# Patient Record
Sex: Male | Born: 1960 | Race: Black or African American | Hispanic: No | Marital: Married | Smoking: Former smoker
Health system: Southern US, Community
[De-identification: ages and names within clinical notes are randomized; demographics above are authoritative.]

## PROBLEM LIST (undated history)

## (undated) DIAGNOSIS — F419 Anxiety disorder, unspecified: Secondary | ICD-10-CM

## (undated) DIAGNOSIS — G20 Parkinson's disease: Secondary | ICD-10-CM

## (undated) DIAGNOSIS — J45909 Unspecified asthma, uncomplicated: Secondary | ICD-10-CM

## (undated) DIAGNOSIS — I10 Essential (primary) hypertension: Secondary | ICD-10-CM

## (undated) HISTORY — PX: REPLACEMENT TOTAL KNEE: SUR1224

## (undated) HISTORY — PX: CARPAL TUNNEL RELEASE: SHX101

## (undated) HISTORY — PX: CHOLECYSTECTOMY: SHX55

---

## 2006-10-04 DIAGNOSIS — I10 Essential (primary) hypertension: Secondary | ICD-10-CM | POA: Diagnosis present

## 2019-07-20 DIAGNOSIS — E1169 Type 2 diabetes mellitus with other specified complication: Secondary | ICD-10-CM | POA: Diagnosis present

## 2020-02-17 DIAGNOSIS — G20A1 Parkinson's disease without dyskinesia, without mention of fluctuations: Secondary | ICD-10-CM | POA: Diagnosis present

## 2020-02-17 DIAGNOSIS — G20 Parkinson's disease: Secondary | ICD-10-CM | POA: Diagnosis present

## 2020-08-25 ENCOUNTER — Emergency Department: Payer: Medicare HMO

## 2020-08-25 ENCOUNTER — Other Ambulatory Visit: Payer: Self-pay

## 2020-08-25 ENCOUNTER — Emergency Department
Admission: EM | Admit: 2020-08-25 | Discharge: 2020-08-26 | Disposition: A | Discharge status: Home / Self Care | Payer: Medicare HMO | Attending: Emergency Medicine | Admitting: Emergency Medicine

## 2020-08-25 DIAGNOSIS — R0602 Shortness of breath: Secondary | ICD-10-CM

## 2020-08-25 DIAGNOSIS — J441 Chronic obstructive pulmonary disease with (acute) exacerbation: Secondary | ICD-10-CM | POA: Insufficient documentation

## 2020-08-25 LAB — CBC WITH DIFFERENTIAL/PLATELET
Abs Immature Granulocytes: 0.01 10*3/uL (ref 0.00–0.07)
Basophils Absolute: 0.1 10*3/uL (ref 0.0–0.1)
Basophils Relative: 1 %
Eosinophils Absolute: 0.3 10*3/uL (ref 0.0–0.5)
Eosinophils Relative: 3 %
HCT: 44.3 % (ref 39.0–52.0)
Hemoglobin: 14.9 g/dL (ref 13.0–17.0)
Immature Granulocytes: 0 %
Lymphocytes Relative: 31 %
Lymphs Abs: 2.5 10*3/uL (ref 0.7–4.0)
MCH: 33 pg (ref 26.0–34.0)
MCHC: 33.6 g/dL (ref 30.0–36.0)
MCV: 98 fL (ref 80.0–100.0)
Monocytes Absolute: 0.7 10*3/uL (ref 0.1–1.0)
Monocytes Relative: 9 %
Neutro Abs: 4.6 10*3/uL (ref 1.7–7.7)
Neutrophils Relative %: 56 %
Platelets: 264 10*3/uL (ref 150–400)
RBC: 4.52 MIL/uL (ref 4.22–5.81)
RDW: 13.6 % (ref 11.5–15.5)
WBC: 8.1 10*3/uL (ref 4.0–10.5)
nRBC: 0 % (ref 0.0–0.2)

## 2020-08-25 LAB — COMPREHENSIVE METABOLIC PANEL
ALT: 6 U/L (ref 0–44)
AST: 30 U/L (ref 15–41)
Albumin: 4.1 g/dL (ref 3.5–5.0)
Alkaline Phosphatase: 52 U/L (ref 38–126)
Anion gap: 8 (ref 5–15)
BUN: 14 mg/dL (ref 8–23)
CO2: 24 mmol/L (ref 22–32)
Calcium: 8.9 mg/dL (ref 8.9–10.3)
Chloride: 105 mmol/L (ref 98–111)
Creatinine, Ser: 0.83 mg/dL (ref 0.61–1.24)
GFR, Estimated: >60 mL/min (ref >60)
Glucose, Bld: 141 mg/dL — ABNORMAL HIGH (ref 70–99)
Potassium: 3.8 mmol/L (ref 3.5–5.1)
Sodium: 137 mmol/L (ref 135–145)
Total Bilirubin: 1 mg/dL (ref 0.3–1.2)
Total Protein: 7 g/dL (ref 6.5–8.1)

## 2020-08-25 LAB — TROPONIN I (HIGH SENSITIVITY): Troponin I (High Sensitivity): 4 ng/L (ref <18)

## 2020-08-25 MED ORDER — IPRATROPIUM-ALBUTEROL 0.5-2.5 (3) MG/3ML IN SOLN
3.0000 mL | Freq: Once | RESPIRATORY_TRACT | Status: AC
  Administered 2020-08-25: 3 mL via RESPIRATORY_TRACT
  Filled 2020-08-25: qty 3

## 2020-08-25 MED ORDER — AEROCHAMBER Z-STAT PLUS/MEDIUM MISC
1.0000 | Freq: Once | Status: AC
  Administered 2020-08-26: 1
  Filled 2020-08-25: qty 1

## 2020-08-25 MED ORDER — METHYLPREDNISOLONE 4 MG PO TBPK: ORAL_TABLET | ORAL | 0 refills | Status: DC

## 2020-08-25 MED ORDER — ALBUTEROL SULFATE HFA 108 (90 BASE) MCG/ACT IN AERS
2.0000 | INHALATION_SPRAY | RESPIRATORY_TRACT | Status: DC | PRN
  Administered 2020-08-26: 2 via RESPIRATORY_TRACT
  Filled 2020-08-25: qty 6.7

## 2020-08-25 MED ORDER — IPRATROPIUM BROMIDE HFA 17 MCG/ACT IN AERS: 2.0000 | INHALATION_SPRAY | Freq: Four times a day (QID) | RESPIRATORY_TRACT | 2 refills | Status: DC

## 2020-08-25 MED ORDER — AEROCHAMBER PLUS FLO-VU MEDIUM MISC: 1.0000 | Freq: Once | Status: DC

## 2020-08-25 MED ORDER — METHYLPREDNISOLONE SODIUM SUCC 125 MG IJ SOLR
125.0000 mg | Freq: Once | INTRAMUSCULAR | Status: AC
  Administered 2020-08-25: 125 mg via INTRAVENOUS
  Filled 2020-08-25: qty 2

## 2020-08-25 NOTE — ED Triage Notes (Addendum)
Pt states has had 10 days of increased "gasping for air" at night and increased use of inhaler. Pt with clear breath sounds ausculted in all lung fields, but continues to state he is wheezing. Pt able to speak in full sentences, no retractions noted. Pt demanding to be "take back there" and given iv steroids. Pt states his chest is tight.

## 2020-08-25 NOTE — ED Notes (Signed)
Pt states out of some of his home meds, atrovent in particular. Pt also is looking for PCP and pulmonologist, recently moved to area.

## 2020-08-25 NOTE — ED Provider Notes (Signed)
[facility] Emergency Department Provider Note   ____________________________________________   Event Date/Time   First MD Initiated Contact with Patient 08/25/20 2308     (approximate)  I have reviewed the triage vital signs and the nursing notes.   HISTORY  Chief Complaint "i'm gasping for air"    HPI Jeremiah Taylor. is a 60 y.o. male with stated past medical history of COPD and Parkinson's who presents for shortness of breath.  Patient states that this has been worsening over the last 10 days as he has been out of some of his home meds including Atrovent.  Patient states that he does not have a PCP or pulmonologist after recently moving to the area.  Patient also endorses associated chest tightness and dyspnea on exertion.  Chest pain not linked to exertion.  Patient currently denies any vision changes, tinnitus, difficulty speaking, facial droop, sore throat, abdominal pain, nausea/vomiting/diarrhea, dysuria, or weakness/numbness/paresthesias in any extremity         No past medical history on file.  There are no problems to display for this patient.     Prior to Admission medications   Medication Sig Start Date End Date Taking? Authorizing Provider  amLODipine (NORVASC) 10 MG tablet Take 10 mg by mouth daily.   Yes [provider]  atorvastatin (LIPITOR) 20 MG tablet Take 20 mg by mouth daily.   Yes [provider]  carbidopa-levodopa (SINEMET IR) 25-100 MG tablet Take 3 tablets by mouth 4 (four) times daily.   Yes [provider]  Carbidopa-Levodopa ER (SINEMET CR) 25-100 MG tablet controlled release Take 1 tablet by mouth 2 (two) times daily.   Yes [provider]  carvedilol (COREG) 25 MG tablet Take 25 mg by mouth 2 (two) times daily with a meal.   Yes [provider]  ipratropium (ATROVENT HFA) 17 MCG/ACT inhaler Inhale 2 puffs into the lungs 4 (four) times daily. 08/25/20 08/25/21 Yes Darielys Giglia,  Clent Jacks, MD  ipratropium-albuterol (DUONEB) 0.5-2.5 (3) MG/3ML SOLN Take 3 mLs by nebulization.   Yes [provider]  lisinopril (ZESTRIL) 40 MG tablet Take 40 mg by mouth daily.   Yes [provider]  methylPREDNISolone (MEDROL DOSEPAK) 4 MG TBPK tablet Follow instructions in package 08/25/20  Yes Pascual Mantel, Clent Jacks, MD    Allergies Patient has no known allergies.  No family history on file.  Social History    Review of Systems Constitutional: No fever/chills Eyes: No visual changes. ENT: No sore throat. Cardiovascular: Endorses chest pain. Respiratory: Endorses shortness of breath. Gastrointestinal: No abdominal pain.  No nausea, no vomiting.  No diarrhea. Genitourinary: Negative for dysuria. Musculoskeletal: Negative for acute arthralgias Skin: Negative for rash. Neurological: Negative for headaches, weakness/numbness/paresthesias in any extremity Psychiatric: Negative for suicidal ideation/homicidal ideation   ____________________________________________   PHYSICAL EXAM:  VITAL SIGNS: ED Triage Vitals  Enc Vitals Group     BP 08/25/20 2055 (!) 155/91     Pulse Rate 08/25/20 2055 81     Resp 08/25/20 2055 20     Temp 08/25/20 2055 98.3 F (36.8 C)     Temp Source 08/25/20 2055 Oral     SpO2 08/25/20 2055 100 %     Weight 08/25/20 2056 191 lb (86.6 kg)     Height 08/25/20 2056 5\' 11"  (1.803 m)     Head Circumference --      Peak Flow --      Pain Score 08/25/20 2056 0  Pain Loc --      Pain Edu? --      Excl. in GC? --    Constitutional: Alert and oriented. Well appearing and in no acute distress. Eyes: Conjunctivae are normal. PERRL. Head: Atraumatic. Nose: No congestion/rhinnorhea. Mouth/Throat: Mucous membranes are moist. Neck: No stridor Cardiovascular: Grossly normal heart sounds.  Good peripheral circulation. Respiratory: Normal respiratory effort.  No retractions.  Mild end expiratory wheezes bilaterally Gastrointestinal: Soft and  nontender. No distention. Musculoskeletal: No obvious deformities Neurologic:  Normal speech and language.  Patient has constant body movement that he states is baseline Skin:  Skin is warm and dry. No rash noted. Psychiatric: Mood and affect are normal. Speech and behavior are normal.  ____________________________________________   LABS (all labs ordered are listed, but only abnormal results are displayed)  Labs Reviewed  COMPREHENSIVE METABOLIC PANEL - Abnormal; Notable for the following components:      Result Value   Glucose, Bld 141 (*)    All other components within normal limits  CBC WITH DIFFERENTIAL/PLATELET  TROPONIN I (HIGH SENSITIVITY)   ____________________________________________  EKG  ED ECG REPORT I, Merwyn Katos, the attending physician, personally viewed and interpreted this ECG.  Date: 08/25/2020 EKG Time: 2114 Rate: 77 Rhythm: normal sinus rhythm QRS Axis: normal Intervals: normal ST/T Wave abnormalities: normal Narrative Interpretation: no evidence of acute ischemia  ____________________________________________  ADIOLOGY  ED MD interpretation: 2 view chest x-ray shows no evidence of acute abnormalities including no pneumonia, pneumothorax, or widened mediastinum  Official radiology report(s): DG Chest 2 View  Result Date: 08/25/2020 CLINICAL DATA:  Shortness of breath for 10 days EXAM: CHEST - 2 VIEW COMPARISON:  None. FINDINGS: The heart size and mediastinal contours are within normal limits. Both lungs are clear. The visualized skeletal structures are unremarkable. IMPRESSION: No active cardiopulmonary disease. Electronically Signed   By: Sharlet Salina M.D.   On: 08/25/2020 21:44    ____________________________________________   PROCEDURES  Procedure(s) performed (including Critical Care):  .1-3 Lead EKG Interpretation Performed by: Merwyn Katos, MD Authorized by: Merwyn Katos, MD     Interpretation: normal     ECG rate:  76    ECG rate assessment: normal     Rhythm: sinus rhythm     Ectopy: none     Conduction: normal       ____________________________________________   INITIAL IMPRESSION / ASSESSMENT AND PLAN / ED COURSE  As part of my medical decision making, I reviewed the following data within the electronic MEDICAL RECORD NUMBER Nursing notes reviewed and incorporated, Labs reviewed, EKG interpreted, Old chart reviewed, Radiograph reviewed and Notes from prior ED visits reviewed and incorporated        The patient appears to be suffering from a moderate exacerbation of COPD.  Based on the history, exam, CXR/EKG, and further workup I dont suspect any other emergent cause of this presentation, such as pneumonia, acute coronary syndrome, congestive heart failure, pulmonary embolism, or pneumothorax.  ED Interventions: bronchodilators, steroids, antibiotics, reassess  0007 Reassessment: After treatment, the patients shortness of breath is resolved, and their lung exam has returned to baseline. They are comfortable and want to go home.  Rx: Steroids, Albuterol Disposition: Discharge home with SRP. PCP follow up recommended in next 48hours.      ____________________________________________   FINAL CLINICAL IMPRESSION(S) / ED DIAGNOSES  Final diagnoses:  Shortness of breath  COPD with acute exacerbation [facility])     ED Discharge Orders  Ordered    ipratropium (ATROVENT HFA) 17 MCG/ACT inhaler  4 times daily        08/25/20 2350    methylPREDNISolone (MEDROL DOSEPAK) 4 MG TBPK tablet        08/25/20 2350           Note:  This document was prepared using Dragon voice recognition software and may include unintentional dictation errors.   Merwyn Katos, MD 08/26/20 [phone]

## 2020-08-26 MED ORDER — PREDNISONE 10 MG (21) PO TBPK: ORAL_TABLET | ORAL | 0 refills | Status: DC

## 2020-08-26 MED ORDER — LORAZEPAM 2 MG/ML IJ SOLN
1.0000 mg | Freq: Once | INTRAMUSCULAR | Status: AC
  Administered 2020-08-26: 1 mg via INTRAVENOUS
  Filled 2020-08-26: qty 1

## 2020-08-26 MED ORDER — PREDNISONE 20 MG PO TABS: 60.0000 mg | ORAL_TABLET | Freq: Every day | ORAL | Status: DC

## 2020-08-26 NOTE — ED Notes (Signed)
Pt agreeable with d/c plan as discussed by provider Dr Vicente Males- this nurse has verbally reinforced d/c instructions and provided written instructions - pt acknowledges verbal understanding and denies any additional questions, concerns, needs.

## 2020-11-06 ENCOUNTER — Encounter: Payer: Self-pay | Admitting: Emergency Medicine

## 2020-11-06 ENCOUNTER — Emergency Department
Admission: EM | Admit: 2020-11-06 | Discharge: 2020-11-06 | Disposition: A | Discharge status: Home / Self Care | Payer: Medicare HMO | Attending: Emergency Medicine | Admitting: Emergency Medicine

## 2020-11-06 ENCOUNTER — Emergency Department: Payer: Medicare HMO

## 2020-11-06 ENCOUNTER — Other Ambulatory Visit: Payer: Self-pay

## 2020-11-06 DIAGNOSIS — I10 Essential (primary) hypertension: Secondary | ICD-10-CM | POA: Insufficient documentation

## 2020-11-06 DIAGNOSIS — Z79899 Other long term (current) drug therapy: Secondary | ICD-10-CM | POA: Diagnosis not present

## 2020-11-06 DIAGNOSIS — Z87891 Personal history of nicotine dependence: Secondary | ICD-10-CM | POA: Diagnosis not present

## 2020-11-06 DIAGNOSIS — J209 Acute bronchitis, unspecified: Secondary | ICD-10-CM | POA: Diagnosis not present

## 2020-11-06 DIAGNOSIS — R062 Wheezing: Secondary | ICD-10-CM | POA: Diagnosis present

## 2020-11-06 DIAGNOSIS — Z7951 Long term (current) use of inhaled steroids: Secondary | ICD-10-CM | POA: Diagnosis not present

## 2020-11-06 DIAGNOSIS — J45901 Unspecified asthma with (acute) exacerbation: Secondary | ICD-10-CM | POA: Insufficient documentation

## 2020-11-06 DIAGNOSIS — G20 Parkinson's disease: Secondary | ICD-10-CM | POA: Insufficient documentation

## 2020-11-06 HISTORY — DX: Essential (primary) hypertension: I10

## 2020-11-06 HISTORY — DX: Parkinson's disease: G20

## 2020-11-06 HISTORY — DX: Unspecified asthma, uncomplicated: J45.909

## 2020-11-06 LAB — CBC WITH DIFFERENTIAL/PLATELET
Abs Immature Granulocytes: 0.01 10*3/uL (ref 0.00–0.07)
Basophils Absolute: 0.1 10*3/uL (ref 0.0–0.1)
Basophils Relative: 1 %
Eosinophils Absolute: 0.2 10*3/uL (ref 0.0–0.5)
Eosinophils Relative: 3 %
HCT: 47.9 % (ref 39.0–52.0)
Hemoglobin: 16.2 g/dL (ref 13.0–17.0)
Immature Granulocytes: 0 %
Lymphocytes Relative: 11 %
Lymphs Abs: 0.9 10*3/uL (ref 0.7–4.0)
MCH: 32.6 pg (ref 26.0–34.0)
MCHC: 33.8 g/dL (ref 30.0–36.0)
MCV: 96.4 fL (ref 80.0–100.0)
Monocytes Absolute: 0.2 10*3/uL (ref 0.1–1.0)
Monocytes Relative: 2 %
Neutro Abs: 6.8 10*3/uL (ref 1.7–7.7)
Neutrophils Relative %: 83 %
Platelets: 284 10*3/uL (ref 150–400)
RBC: 4.97 MIL/uL (ref 4.22–5.81)
RDW: 12.9 % (ref 11.5–15.5)
WBC: 8.2 10*3/uL (ref 4.0–10.5)
nRBC: 0 % (ref 0.0–0.2)

## 2020-11-06 LAB — TROPONIN I (HIGH SENSITIVITY): Troponin I (High Sensitivity): 4 ng/L (ref <18)

## 2020-11-06 LAB — BASIC METABOLIC PANEL
Anion gap: 9 (ref 5–15)
BUN: 13 mg/dL (ref 8–23)
CO2: 28 mmol/L (ref 22–32)
Calcium: 9.6 mg/dL (ref 8.9–10.3)
Chloride: 101 mmol/L (ref 98–111)
Creatinine, Ser: 1 mg/dL (ref 0.61–1.24)
GFR, Estimated: >60 mL/min (ref >60)
Glucose, Bld: 162 mg/dL — ABNORMAL HIGH (ref 70–99)
Potassium: 4.2 mmol/L (ref 3.5–5.1)
Sodium: 138 mmol/L (ref 135–145)

## 2020-11-06 MED ORDER — DEXAMETHASONE SODIUM PHOSPHATE 10 MG/ML IJ SOLN
10.0000 mg | Freq: Once | INTRAMUSCULAR | Status: AC
  Administered 2020-11-06: 10 mg via INTRAMUSCULAR
  Filled 2020-11-06: qty 1

## 2020-11-06 MED ORDER — IPRATROPIUM-ALBUTEROL 0.5-2.5 (3) MG/3ML IN SOLN
3.0000 mL | Freq: Once | RESPIRATORY_TRACT | Status: AC
  Administered 2020-11-06: 3 mL via RESPIRATORY_TRACT
  Filled 2020-11-06: qty 3

## 2020-11-06 MED ORDER — IPRATROPIUM-ALBUTEROL 0.5-2.5 (3) MG/3ML IN SOLN
3.0000 mL | Freq: Once | RESPIRATORY_TRACT | Status: AC
Start: 2020-11-06 — End: 2020-11-06
  Administered 2020-11-06: 3 mL via RESPIRATORY_TRACT
  Filled 2020-11-06: qty 3

## 2020-11-06 MED ORDER — PREDNISONE 10 MG PO TABS: ORAL_TABLET | ORAL | 0 refills | Status: DC

## 2020-11-06 MED ORDER — PREDNISONE 20 MG PO TABS
60.0000 mg | ORAL_TABLET | Freq: Once | ORAL | Status: AC
  Administered 2020-11-06: 60 mg via ORAL
  Filled 2020-11-06: qty 3

## 2020-11-06 MED ORDER — DOXYCYCLINE HYCLATE 100 MG PO CAPS: 100.0000 mg | ORAL_CAPSULE | Freq: Two times a day (BID) | ORAL | 0 refills | Status: DC

## 2020-11-06 NOTE — Discharge Instructions (Signed)
Follow-up with your primary care provider if any continued problems or concerns.  Return to the emergency department if any worsening of your symptoms over the holiday weekend.  Continue using your nebulizer machine at home and your albuterol inhaler.  A prescription for prednisone was sent to the pharmacy to begin taking tomorrow.  A prescription for doxycycline which is the antibiotic should be started today.  This is 1 twice a day for the next 10 days.

## 2020-11-06 NOTE — ED Triage Notes (Signed)
Pt comes into the ED via POV c/o asthma.  Pt states he has been using his home inhalers and nebulizer's with no success.  PT admits his asthma gets worse this time a year with his allergies.  Pt ambulatory to triage at this time with [facility] noted.  Pt does also have h/o of parkinsons. Pt currently is not using accessory muscles for breathing.

## 2020-11-06 NOTE — ED Provider Notes (Signed)
[facility] Emergency Department Provider Note  ____________________________________________   Event Date/Time   First MD Initiated Contact with Patient 11/06/20 1116     (approximate)  I have reviewed the triage vital signs and the nursing notes.   HISTORY  Chief Complaint Asthma   HPI Jeremiah Taylor. is a 60 y.o. male presents to the ED with complaint of asthma flare.  Patient states that his asthma has been getting worse for the last week and a half.  He states that he has continued using his medication at home without any relief.  He states that the last 3 days his asthma has been unrelieved by his medication at home.  He denies any fever, chills, nausea or vomiting.  Patient has not been COVID vaccinated.  Currently is taking blood pressure medication and states that he sees a doctor at [facility] primary.  Patient also has a history of Parkinson's, hypertension along with his asthma.  He reports that he has taken his blood pressure medication today.       Past Medical History:  Diagnosis Date  . Asthma   . Hypertension   . Parkinson disease (HCC)     There are no problems to display for this patient.   Past Surgical History:  Procedure Laterality Date  . CARPAL TUNNEL RELEASE    . CHOLECYSTECTOMY    . REPLACEMENT TOTAL KNEE      Prior to Admission medications   Medication Sig Start Date End Date Taking? Authorizing Provider  doxycycline (VIBRAMYCIN) 100 MG capsule Take 1 capsule (100 mg total) by mouth 2 (two) times daily. 11/06/20  Yes Tommi Rumps, PA-C  predniSONE (DELTASONE) 10 MG tablet Take 5 tablets tomorrow, take 4 tablets on day 2, day 3 take 3 tablets, day 4 take 2 tablets, day 5 take 1 tablets 11/06/20  Yes Bridget Hartshorn L, PA-C  amLODipine (NORVASC) 10 MG tablet Take 10 mg by mouth daily.    [provider]  atorvastatin (LIPITOR) 20 MG tablet Take 20 mg by mouth daily.    [provider]  carbidopa-levodopa  (SINEMET IR) 25-100 MG tablet Take 3 tablets by mouth 4 (four) times daily.    [provider]  Carbidopa-Levodopa ER (SINEMET CR) 25-100 MG tablet controlled release Take 1 tablet by mouth 2 (two) times daily.    [provider]  carvedilol (COREG) 25 MG tablet Take 25 mg by mouth 2 (two) times daily with a meal.    [provider]  ipratropium (ATROVENT HFA) 17 MCG/ACT inhaler Inhale 2 puffs into the lungs 4 (four) times daily. 08/25/20 08/25/21  Merwyn Katos, MD  ipratropium-albuterol (DUONEB) 0.5-2.5 (3) MG/3ML SOLN Take 3 mLs by nebulization.    [provider]  lisinopril (ZESTRIL) 40 MG tablet Take 40 mg by mouth daily.    [provider]    Allergies Patient has no known allergies.  History reviewed. No pertinent family history.  Social History Social History   Tobacco Use  . Smoking status: Former Games developer  . Smokeless tobacco: Never Used  Substance Use Topics  . Alcohol use: Not Currently  . Drug use: Not Currently    Review of Systems Constitutional: No fever/chills Eyes: No visual changes. ENT: No sore throat. Cardiovascular: Denies chest pain. Respiratory: Positive for asthma.  Positive for cough. Gastrointestinal: No abdominal pain.  No nausea, no vomiting.  No diarrhea.  No constipation. Genitourinary: Negative for dysuria. Musculoskeletal: Negative for body aches. Skin: Negative  for rash. Neurological: Negative for headaches, focal weakness or numbness.  ____________________________________________   PHYSICAL EXAM:  VITAL SIGNS: ED Triage Vitals [11/06/20 1112]  Enc Vitals Group     BP (S) (!) 143/107     Pulse Rate 71     Resp (!) 22     Temp 98.1 F (36.7 C)     Temp Source Oral     SpO2 97 %     Weight 188 lb (85.3 kg)     Height 5\' 11"  (1.803 m)     Head Circumference      Peak Flow      Pain Score 0     Pain Loc      Pain Edu?      Excl. in GC?    Constitutional: Alert and oriented. Well  appearing and in no acute distress. Eyes: Conjunctivae are normal. PERRL. EOMI. Head: Atraumatic. Nose: No congestion/rhinnorhea. Neck: No stridor.   Cardiovascular: Normal rate, regular rhythm. Grossly normal heart sounds.  Good peripheral circulation. Respiratory: Bilateral expiratory wheezes noted throughout.  Patient is able to answer questions without difficulty.  No cough was noted during exam. Gastrointestinal: Soft and nontender. No distention. No abdominal bruits. No CVA tenderness. Musculoskeletal: No lower extremity tenderness nor edema.  No edema to lower extremities.   Neurologic:  Normal speech and language. No gross focal neurologic deficits are appreciated. No gait instability. Skin:  Skin is warm, dry and intact. No rash noted. Psychiatric: Mood and affect are normal. Speech and behavior are normal.  ____________________________________________   LABS (all labs ordered are listed, but only abnormal results are displayed)  Labs Reviewed  BASIC METABOLIC PANEL - Abnormal; Notable for the following components:      Result Value   Glucose, Bld 162 (*)    All other components within normal limits  CBC WITH DIFFERENTIAL/PLATELET  TROPONIN I (HIGH SENSITIVITY)   ____________________________________________  EKG Reviewed by physician in Main ED. Normal sinus rhythm with ventricular rate of 69. ____________________________________________  RADIOLOGY I, , personally viewed and evaluated these images (plain radiographs) as part of my medical decision making, as well as reviewing the written report by the radiologist.   Official radiology report(s): DG Chest 2 View  Result Date: 11/06/2020 CLINICAL DATA:  Asthma.  Wheezing. EXAM: CHEST - 2 VIEW COMPARISON:  08/25/2020 FINDINGS: Heart size is normal. Ordinary aortic atherosclerotic calcification is noted. There is bronchial thickening, more than on the previous study. No evidence of consolidation, collapse  or effusion. Old rib fracture on the left. No acute bone finding. IMPRESSION: Bronchial thickening consistent with bronchitis. No consolidation, collapse or effusion. Electronically Signed   By: 08/27/2020 M.D.   On: 11/06/2020 13:16    ____________________________________________   PROCEDURES  Procedure(s) performed (including Critical Care):  Procedures   ____________________________________________   INITIAL IMPRESSION / ASSESSMENT AND PLAN / ED COURSE  As part of my medical decision making, I reviewed the following data within the electronic MEDICAL RECORD NUMBER Notes from prior ED visits and [place] Controlled Substance Database   After 2 nebulizer treatments patient began feeling better and decreased wheezing.  He then complained of chest wall pain which made him very anxious.  EKG, troponin and lab work was reassuring.  Chest x-ray did show bronchitis.  Patient was made aware.  ----------------------------------------- 2:30 PM on 11/06/2020 ----------------------------------------- Recheck prior to discharge and patient is feeling much better.  Wheezing is greatly reduced.  He is encouraged to follow-up with his  PCP or return to the emergency department if any worsening of his symptoms over the weekend.  A prescription for prednisone and doxycycline was sent to his pharmacy.  He states that he has nebulizer solution and an albuterol inhaler at home.   ____________________________________________   FINAL CLINICAL IMPRESSION(S) / ED DIAGNOSES  Final diagnoses:  Acute bronchitis with asthma with acute exacerbation     ED Discharge Orders         Ordered    predniSONE (DELTASONE) 10 MG tablet        11/06/20 1441    doxycycline (VIBRAMYCIN) 100 MG capsule  2 times daily        11/06/20 1441          *Please note:  Tretha Sciara. was evaluated in Emergency Department on 11/06/2020 for the symptoms described in the history of present illness. He was evaluated in the  context of the global COVID-19 pandemic, which necessitated consideration that the patient might be at risk for infection with the SARS-CoV-2 virus that causes COVID-19. Institutional protocols and algorithms that pertain to the evaluation of patients at risk for COVID-19 are in a state of rapid change based on information released by regulatory bodies including the CDC and federal and state organizations. These policies and algorithms were followed during the patient's care in the ED.  Some ED evaluations and interventions may be delayed as a result of limited staffing during and the pandemic.*   Note:  This document was prepared using Dragon voice recognition software and may include unintentional dictation errors.    Tommi Rumps, PA-C 11/06/20 1506    Shaune Pollack, MD 11/07/20 [phone]

## 2020-12-08 ENCOUNTER — Other Ambulatory Visit: Payer: Self-pay

## 2020-12-08 DIAGNOSIS — J45909 Unspecified asthma, uncomplicated: Secondary | ICD-10-CM | POA: Insufficient documentation

## 2020-12-08 DIAGNOSIS — I10 Essential (primary) hypertension: Secondary | ICD-10-CM | POA: Diagnosis not present

## 2020-12-08 DIAGNOSIS — Z87891 Personal history of nicotine dependence: Secondary | ICD-10-CM | POA: Insufficient documentation

## 2020-12-08 DIAGNOSIS — Z7951 Long term (current) use of inhaled steroids: Secondary | ICD-10-CM | POA: Insufficient documentation

## 2020-12-08 DIAGNOSIS — S0003XA Contusion of scalp, initial encounter: Secondary | ICD-10-CM | POA: Insufficient documentation

## 2020-12-08 DIAGNOSIS — Y9389 Activity, other specified: Secondary | ICD-10-CM | POA: Insufficient documentation

## 2020-12-08 DIAGNOSIS — W1809XA Striking against other object with subsequent fall, initial encounter: Secondary | ICD-10-CM | POA: Diagnosis not present

## 2020-12-08 DIAGNOSIS — Z79899 Other long term (current) drug therapy: Secondary | ICD-10-CM | POA: Diagnosis not present

## 2020-12-08 DIAGNOSIS — Y9289 Other specified places as the place of occurrence of the external cause: Secondary | ICD-10-CM | POA: Diagnosis not present

## 2020-12-08 DIAGNOSIS — G20 Parkinson's disease: Secondary | ICD-10-CM | POA: Diagnosis not present

## 2020-12-08 DIAGNOSIS — S0990XA Unspecified injury of head, initial encounter: Secondary | ICD-10-CM | POA: Diagnosis present

## 2020-12-08 LAB — CBC WITH DIFFERENTIAL/PLATELET
Abs Immature Granulocytes: 0.05 10*3/uL (ref 0.00–0.07)
Basophils Absolute: 0.1 10*3/uL (ref 0.0–0.1)
Basophils Relative: 0 %
Eosinophils Absolute: 0.1 10*3/uL (ref 0.0–0.5)
Eosinophils Relative: 1 %
HCT: 45 % (ref 39.0–52.0)
Hemoglobin: 15.2 g/dL (ref 13.0–17.0)
Immature Granulocytes: 0 %
Lymphocytes Relative: 13 %
Lymphs Abs: 1.7 10*3/uL (ref 0.7–4.0)
MCH: 32.8 pg (ref 26.0–34.0)
MCHC: 33.8 g/dL (ref 30.0–36.0)
MCV: 97.2 fL (ref 80.0–100.0)
Monocytes Absolute: 0.8 10*3/uL (ref 0.1–1.0)
Monocytes Relative: 6 %
Neutro Abs: 10.6 10*3/uL — ABNORMAL HIGH (ref 1.7–7.7)
Neutrophils Relative %: 80 %
Platelets: 285 10*3/uL (ref 150–400)
RBC: 4.63 MIL/uL (ref 4.22–5.81)
RDW: 13.2 % (ref 11.5–15.5)
WBC: 13.3 10*3/uL — ABNORMAL HIGH (ref 4.0–10.5)
nRBC: 0 % (ref 0.0–0.2)

## 2020-12-08 LAB — COMPREHENSIVE METABOLIC PANEL
ALT: 5 U/L (ref 0–44)
AST: 21 U/L (ref 15–41)
Albumin: 4 g/dL (ref 3.5–5.0)
Alkaline Phosphatase: 48 U/L (ref 38–126)
Anion gap: 9 (ref 5–15)
BUN: 21 mg/dL (ref 8–23)
CO2: 20 mmol/L — ABNORMAL LOW (ref 22–32)
Calcium: 9.1 mg/dL (ref 8.9–10.3)
Chloride: 106 mmol/L (ref 98–111)
Creatinine, Ser: 1.03 mg/dL (ref 0.61–1.24)
GFR, Estimated: >60 mL/min (ref >60)
Glucose, Bld: 126 mg/dL — ABNORMAL HIGH (ref 70–99)
Potassium: 3.8 mmol/L (ref 3.5–5.1)
Sodium: 135 mmol/L (ref 135–145)
Total Bilirubin: 1.2 mg/dL (ref 0.3–1.2)
Total Protein: 7.3 g/dL (ref 6.5–8.1)

## 2020-12-08 NOTE — ED Triage Notes (Signed)
Pt states he was trying to take a bed apart when a piece of the bed fell back and hit him. Pt with hematoma to left forehead. Pt with unknown loc. Pt also complains of low back pain. Pt with history of parkinson's. PA assessing pt in triage.

## 2020-12-08 NOTE — ED Provider Notes (Signed)
Emergency Medicine Provider Triage Evaluation Note  Jeremiah Taylor. , a 60 y.o. male  was evaluated in triage.  Patient has a history of Parkinson's disease.  Patient reports that he was attempting to move a bed when he lost his balance and hit his head against a bedpost.  He is unsure of loss of consciousness.  No numbness or tingling in the upper and lower extremities.  No chest pain, chest tightness or abdominal pain.  Patient reports that he has had 3 other falls today and is unsure why.  Patient has left parietal hematoma but no other abrasions or lacerations.  Review of Systems  Positive: Patient has headache. Negative: No neck pain.  No chest pain, chest tightness or abdominal pain.  No nausea or vomiting.  Physical Exam  BP (!) 139/50   Pulse 75   Resp 16   Ht 5\' 11"  (1.803 m)   Wt 85.3 kg   SpO2 100%   BMI 26.22 kg/m  Gen:   Awake, no distress  Resp:  Normal effort MSK:   Moves extremities without difficulty Other:  Patient has left parietal hematoma.  Medical Decision Making  Medically screening exam initiated at 11:40 PM.  Appropriate orders placed.  Aeron . was informed that the remainder of the evaluation will be completed by another provider, this initial triage assessment does not replace that evaluation, and the importance of remaining in the ED until their evaluation is complete.  Will obtain basic labs and will obtain CTs of the head and cervical spine and will reassess.   Christen Butter [place], PA-C 12/08/20 12/10/20    Ouida Sills, MD 12/08/20 [phone]

## 2020-12-09 ENCOUNTER — Emergency Department
Admission: EM | Admit: 2020-12-09 | Discharge: 2020-12-09 | Disposition: A | Discharge status: Home / Self Care | Payer: Medicare HMO | Attending: Emergency Medicine | Admitting: Emergency Medicine

## 2020-12-09 ENCOUNTER — Emergency Department: Payer: Medicare HMO

## 2020-12-09 ENCOUNTER — Other Ambulatory Visit: Payer: Medicare HMO

## 2020-12-09 DIAGNOSIS — S0003XA Contusion of scalp, initial encounter: Secondary | ICD-10-CM

## 2020-12-09 DIAGNOSIS — S0990XA Unspecified injury of head, initial encounter: Secondary | ICD-10-CM

## 2020-12-09 DIAGNOSIS — W19XXXA Unspecified fall, initial encounter: Secondary | ICD-10-CM

## 2020-12-09 MED ORDER — LORAZEPAM 2 MG/ML IJ SOLN
1.0000 mg | Freq: Once | INTRAMUSCULAR | Status: AC
  Administered 2020-12-09: 1 mg via INTRAVENOUS
  Filled 2020-12-09: qty 1

## 2020-12-09 MED ORDER — DIPHENHYDRAMINE HCL 50 MG/ML IJ SOLN
25.0000 mg | Freq: Once | INTRAMUSCULAR | Status: AC
  Administered 2020-12-09: 25 mg via INTRAVENOUS
  Filled 2020-12-09: qty 1

## 2020-12-09 MED ORDER — HALOPERIDOL LACTATE 5 MG/ML IJ SOLN
5.0000 mg | Freq: Once | INTRAMUSCULAR | Status: DC
  Filled 2020-12-09: qty 1

## 2020-12-09 MED ORDER — SODIUM CHLORIDE 0.9 % IV BOLUS
1000.0000 mL | Freq: Once | INTRAVENOUS | Status: AC
  Administered 2020-12-09: 1000 mL via INTRAVENOUS

## 2020-12-09 MED ORDER — KETOROLAC TROMETHAMINE 30 MG/ML IJ SOLN
10.0000 mg | Freq: Once | INTRAMUSCULAR | Status: AC
  Administered 2020-12-09: 9.9 mg via INTRAVENOUS
  Filled 2020-12-09: qty 1

## 2020-12-09 NOTE — ED Provider Notes (Signed)
[facility] Emergency Department Provider Note   ____________________________________________   Event Date/Time   First MD Initiated Contact with Patient 12/09/20 [phone]     (approximate)  I have reviewed the triage vital signs and the nursing notes.   HISTORY  Chief Complaint Head Injury    HPI Jeremiah [facility]. is a 60 y.o. male who presents to the ED from home with a chief complaint of head injury.  Patient with a history of Parkinson's disease he was trying to take a part of bed when a piece of the heavy bed fell back and struck him in the left forehead, knocking patient to the ground.  Patient denies LOC.  However, complains of dizziness resulting in for subsequent falls.  Has not taken his nighttime Xanax so his involuntary muscle movements are heightened.  Denies vision changes, neck pain, chest pain, shortness of breath, abdominal pain, nausea or vomiting.  Denies anticoagulant use.     Past Medical History:  Diagnosis Date   Asthma    Hypertension    Parkinson disease (HCC)     There are no problems to display for this patient.   Past Surgical History:  Procedure Laterality Date   CARPAL TUNNEL RELEASE     CHOLECYSTECTOMY     REPLACEMENT TOTAL KNEE      Prior to Admission medications   Medication Sig Start Date End Date Taking? Authorizing Provider  amLODipine (NORVASC) 10 MG tablet Take 10 mg by mouth daily.    [provider]  atorvastatin (LIPITOR) 20 MG tablet Take 20 mg by mouth daily.    [provider]  carbidopa-levodopa (SINEMET IR) 25-100 MG tablet Take 3 tablets by mouth 4 (four) times daily.    [provider]  Carbidopa-Levodopa ER (SINEMET CR) 25-100 MG tablet controlled release Take 1 tablet by mouth 2 (two) times daily.    [provider]  carvedilol (COREG) 25 MG tablet Take 25 mg by mouth 2 (two) times daily with a meal.    [provider]  doxycycline (VIBRAMYCIN) 100 MG  capsule Take 1 capsule (100 mg total) by mouth 2 (two) times daily. 11/06/20   Bridget Hartshorn L, PA-C  ipratropium (ATROVENT HFA) 17 MCG/ACT inhaler Inhale 2 puffs into the lungs 4 (four) times daily. 08/25/20 08/25/21  Merwyn Katos, MD  ipratropium-albuterol (DUONEB) 0.5-2.5 (3) MG/3ML SOLN Take 3 mLs by nebulization.    [provider]  lisinopril (ZESTRIL) 40 MG tablet Take 40 mg by mouth daily.    [provider]  predniSONE (DELTASONE) 10 MG tablet Take 5 tablets tomorrow, take 4 tablets on day 2, day 3 take 3 tablets, day 4 take 2 tablets, day 5 take 1 tablets 11/06/20   Tommi Rumps, PA-C    Allergies Patient has no known allergies.  No family history on file.  Social History Social History   Tobacco Use   Smoking status: Former    Pack years: 0.00   Smokeless tobacco: Never  Substance Use Topics   Alcohol use: Not Currently   Drug use: Not Currently    Review of Systems  Constitutional: No fever/chills Eyes: No visual changes. ENT: No sore throat. Cardiovascular: Denies chest pain. Respiratory: Denies shortness of breath. Gastrointestinal: No abdominal pain.  No nausea, no vomiting.  No diarrhea.  No constipation. Genitourinary: Negative for dysuria. Musculoskeletal: Negative for back pain. Skin: Negative for rash. Neurological: Positive for head injury.  Negative for focal weakness or numbness.  ____________________________________________   PHYSICAL EXAM:  VITAL SIGNS: ED Triage Vitals  Enc Vitals Group     BP 12/08/20 2338 (!) 139/50     Pulse Rate 12/08/20 2336 75     Resp 12/08/20 2336 16     Temp --      Temp Source 12/08/20 2336 Oral     SpO2 12/08/20 2336 100 %     Weight 12/08/20 2336 188 lb (85.3 kg)     Height 12/08/20 2336 5\' 11"  (1.803 m)     Head Circumference --      Peak Flow --      Pain Score 12/08/20 2336 8     Pain Loc --      Pain Edu? --      Excl. in GC? --     Constitutional: Alert and oriented. Well  appearing and in no acute distress. Eyes: Conjunctivae are normal. PERRL. EOMI. Head: Left parietal hematoma. Nose: Atraumatic. Mouth/Throat: Mucous membranes are moist.  No dental malocclusion. Neck: No stridor.  No cervical spine tenderness to palpation. Cardiovascular: Normal rate, regular rhythm. Grossly normal heart sounds.  Good peripheral circulation. Respiratory: Normal respiratory effort.  No retractions. Lungs CTAB. Gastrointestinal: Soft and nontender. No distention. No abdominal bruits. No CVA tenderness. Musculoskeletal: Involuntary movements of mainly upper limbs.  No lower extremity tenderness nor edema.  No joint effusions. Neurologic: Alert and oriented x3.  CN II to XII are fully intact.  Normal speech and language. No gross focal neurologic deficits are appreciated.  Skin:  Skin is warm, dry and intact. No rash noted. Psychiatric: Mood and affect are normal. Speech and behavior are normal.  ____________________________________________   LABS (all labs ordered are listed, but only abnormal results are displayed)  Labs Reviewed  CBC WITH DIFFERENTIAL/PLATELET - Abnormal; Notable for the following components:      Result Value   WBC 13.3 (*)    Neutro Abs 10.6 (*)    All other components within normal limits  COMPREHENSIVE METABOLIC PANEL - Abnormal; Notable for the following components:   CO2 20 (*)    Glucose, Bld 126 (*)    All other components within normal limits  URINALYSIS, COMPLETE (UACMP) WITH MICROSCOPIC   ____________________________________________  EKG  None ____________________________________________  RADIOLOGY I, Makhiya Coburn J, personally viewed and evaluated these images (plain radiographs) as part of my medical decision making, as well as reviewing the written report by the radiologist.  ED MD interpretation: No ICH, no cervical spine fracture/dislocation  Official radiology report(s): CT Head Wo Contrast  Result Date: 12/09/2020 CLINICAL  DATA:  Blunt head injury, neck injury EXAM: CT HEAD WITHOUT CONTRAST CT CERVICAL SPINE WITHOUT CONTRAST TECHNIQUE: Multidetector CT imaging of the head and cervical spine was performed following the standard protocol without intravenous contrast. Multiplanar CT image reconstructions of the cervical spine were also generated. COMPARISON:  None FINDINGS: CT HEAD FINDINGS Brain: Imaging is slightly limited by motion artifact. No abnormal intra or extra-axial mass lesion or fluid collection. No abnormal mass effect or midline shift. No evidence of acute intracranial hemorrhage or infarct. Ventricular size is normal. Cerebellum unremarkable. Vascular: Unremarkable Skull: Intact Sinuses/Orbits: Paranasal sinuses are clear. Orbits are unremarkable. Other: Mastoid air cells and middle ear cavities are clear. Small left frontotemporal scalp hematoma noted. CT CERVICAL SPINE FINDINGS Alignment: 2-3 mm retrolisthesis of C6 upon C7. Mild reversal of the normal cervical lordosis. Skull base and vertebrae: Imaging is slightly limited by motion artifact. Craniocervical alignment is normal. Atlantodental interval is  not widened. No acute fracture identified. Soft tissues and spinal canal: Moderate central canal stenosis secondary to posterior disc osteophyte complex at C6-7 results in mild flattening of the thecal sac, not well assessed on this examination. No canal hematoma. The prevertebral soft tissues are not thickened. No paraspinal fluid collection. Disc levels: There is intervertebral disc space narrowing and endplate remodeling at C5-C7 in keeping with changes of moderate degenerative disc disease. Milder degenerative changes are noted at C4-5. Vertebral body heights are preserved. The prevertebral soft tissues are not thickened on sagittal reformats. Review of the axial images demonstrates moderate uncovertebral arthrosis at C6-7 and C5-6 resulting in mild-to-moderate bilateral neuroforaminal narrowing at C5-6 remaining  neural foramina are widely patent. Upper chest: Unremarkable Other: None IMPRESSION: No acute intracranial injury. No calvarial fracture. Small left frontal scalp hematoma. No acute fracture or listhesis of the cervical spine. 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 Electronically Signed   By: Helyn NumbersAshesh  Parikh MD   On: 12/09/2020 04:13   CT Cervical Spine Wo Contrast  Result Date: 12/09/2020 CLINICAL DATA:  Blunt head injury, neck injury EXAM: CT HEAD WITHOUT CONTRAST CT CERVICAL SPINE WITHOUT CONTRAST TECHNIQUE: Multidetector CT imaging of the head and cervical spine was performed following the standard protocol without intravenous contrast. Multiplanar CT image reconstructions of the cervical spine were also generated. COMPARISON:  None FINDINGS: CT HEAD FINDINGS Brain: Imaging is slightly limited by motion artifact. No abnormal intra or extra-axial mass lesion or fluid collection. No abnormal mass effect or midline shift. No evidence of acute intracranial hemorrhage or infarct. Ventricular size is normal. Cerebellum unremarkable. Vascular: Unremarkable Skull: Intact Sinuses/Orbits: Paranasal sinuses are clear. Orbits are unremarkable. Other: Mastoid air cells and middle ear cavities are clear. Small left frontotemporal scalp hematoma noted. CT CERVICAL SPINE FINDINGS Alignment: 2-3 mm retrolisthesis of C6 upon C7. Mild reversal of the normal cervical lordosis. Skull base and vertebrae: Imaging is slightly limited by motion artifact. Craniocervical alignment is normal. Atlantodental interval is not widened. No acute fracture identified. Soft tissues and spinal canal: Moderate central canal stenosis secondary to posterior disc osteophyte complex at C6-7 results in mild flattening of the thecal sac, not well assessed on this examination. No canal hematoma. The prevertebral soft  tissues are not thickened. No paraspinal fluid collection. Disc levels: There is intervertebral disc space narrowing and endplate remodeling at C5-C7 in keeping with changes of moderate degenerative disc disease. Milder degenerative changes are noted at C4-5. Vertebral body heights are preserved. The prevertebral soft tissues are not thickened on sagittal reformats. Review of the axial images demonstrates moderate uncovertebral arthrosis at C6-7 and C5-6 resulting in mild-to-moderate bilateral neuroforaminal narrowing at C5-6 remaining neural foramina are widely patent. Upper chest: Unremarkable Other: None IMPRESSION: No acute intracranial injury. No calvarial fracture. Small left frontal scalp hematoma. No acute fracture or listhesis of the cervical spine. 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 Electronically Signed   By: Helyn NumbersAshesh  Parikh MD   On: 12/09/2020 04:13    ____________________________________________   PROCEDURES  Procedure(s) performed (including Critical Care):  Procedures   ____________________________________________   INITIAL IMPRESSION / ASSESSMENT AND PLAN / ED COURSE  As part of my medical decision making, I reviewed the following data within the electronic MEDICAL RECORD NUMBER Nursing notes reviewed and incorporated, Old chart reviewed,  Radiograph reviewed, and Notes from prior ED visits     60 year old male presenting with head injury.  Differential diagnosis includes but is not limited to hematoma, skull fracture, SDH, ICH, etc.  Patient requires IV medications for calming of his involuntary muscle movements secondary to Parkinson's in order to obtain CT scan.  Clinical Course as of 12/09/20 0646  Mon Dec 09, 2020  0626 Patient still moving too much requiring additional IV medications. [JS]  [id number] Patient soundly asleep.  Will continue to  monitor. [JS]  [id number] Patient now alert and awake.  Wife at bedside.  Updated them of all test results.  Wife is comfortable taking patient back home and needs to leave now and in order to go to work.  Involuntary movements have resolved.  Denies dizziness.  No focal neurological deficits visualized.  Strict return precautions given.  Both verbalized understanding agree with plan of care. [JS]  [id number] Patient requested something for hematoma pain prior to discharge. [JS]    Clinical Course User Index [JS] Irean Hong, MD     ____________________________________________   FINAL CLINICAL IMPRESSION(S) / ED DIAGNOSES  Final diagnoses:  Minor head injury, initial encounter  Fall, initial encounter  Scalp hematoma, initial encounter     ED Discharge Orders     None        Note:  This document was prepared using Dragon voice recognition software and may include unintentional dictation errors.    Irean Hong, MD 12/09/20 [phone]

## 2020-12-09 NOTE — ED Triage Notes (Signed)
This RN spoke with EDP Jessup regarding CT unable to complete due to patient moving and patient request for pain medication. Per EDP okay with waiting for patient to come back to room for further eval, no new orders for pain medication at this time.

## 2020-12-09 NOTE — Discharge Instructions (Addendum)
Apply ice to affected area several times daily to reduce swelling.  Return to the ER for worsening symptoms, persistent vomiting, lethargy or other concerns. 

## 2020-12-09 NOTE — ED Notes (Signed)
Pt asking for charge RN name, this RN provided with charge RN name, explained to patient unable to provide last name due to staff safety issue. Pt asking for pain medication, asking about CT. This RN explained waiting for room for EDP to evaluate as discussed with EDP Jessup.

## 2020-12-10 ENCOUNTER — Emergency Department
Admission: EM | Admit: 2020-12-10 | Discharge: 2020-12-10 | Disposition: A | Discharge status: Home / Self Care | Payer: Medicare HMO | Attending: Emergency Medicine | Admitting: Emergency Medicine

## 2020-12-10 ENCOUNTER — Other Ambulatory Visit: Payer: Self-pay

## 2020-12-10 ENCOUNTER — Emergency Department: Payer: Medicare HMO

## 2020-12-10 DIAGNOSIS — I10 Essential (primary) hypertension: Secondary | ICD-10-CM | POA: Insufficient documentation

## 2020-12-10 DIAGNOSIS — Z87891 Personal history of nicotine dependence: Secondary | ICD-10-CM | POA: Diagnosis not present

## 2020-12-10 DIAGNOSIS — Z79899 Other long term (current) drug therapy: Secondary | ICD-10-CM | POA: Diagnosis not present

## 2020-12-10 DIAGNOSIS — Y9389 Activity, other specified: Secondary | ICD-10-CM | POA: Insufficient documentation

## 2020-12-10 DIAGNOSIS — W208XXA Other cause of strike by thrown, projected or falling object, initial encounter: Secondary | ICD-10-CM | POA: Diagnosis not present

## 2020-12-10 DIAGNOSIS — Z96659 Presence of unspecified artificial knee joint: Secondary | ICD-10-CM | POA: Diagnosis not present

## 2020-12-10 DIAGNOSIS — J45909 Unspecified asthma, uncomplicated: Secondary | ICD-10-CM | POA: Insufficient documentation

## 2020-12-10 DIAGNOSIS — G20 Parkinson's disease: Secondary | ICD-10-CM | POA: Diagnosis not present

## 2020-12-10 DIAGNOSIS — S060X0A Concussion without loss of consciousness, initial encounter: Secondary | ICD-10-CM

## 2020-12-10 DIAGNOSIS — S0093XA Contusion of unspecified part of head, initial encounter: Secondary | ICD-10-CM | POA: Diagnosis present

## 2020-12-10 NOTE — ED Triage Notes (Addendum)
Pt comes via EMS from home with c/o head injury. EMS reports pt states he has had some confusion today. Pt had knot noted to left side  of head and neck pain.  Pt has hx of parkinsons.   Pt has injury Sunday and was seen here in ED and evaluated. Pt was moving his headboard and he lost his balance and the headboard fell on him.  VSS

## 2020-12-10 NOTE — ED Provider Notes (Signed)
[facility] Provider Note  ____________________________________________   Event Date/Time   First MD Initiated Contact with Patient 12/10/20 1519     (approximate)  I have reviewed the triage vital signs and the nursing notes.   HISTORY  Chief Complaint Head Injury    HPI Jeremiah [facility]. is a 60 y.o. male with Parkinson's who comes in with headache.  Patient was here yesterday for head injury.  He reports some increasing pain on the left side of his head where he has a hematoma and some mild neck pain.  Patient does have a history of Parkinson's.  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  This is been constant, nothing made it better, nothing makes it worse.  On review of records yesterday patient had CT imaging of his head and neck that were negative          Past Medical History:  Diagnosis Date   Asthma    Hypertension    Parkinson disease (HCC)     There are no problems to display for this patient.   Past Surgical History:  Procedure Laterality Date   CARPAL TUNNEL RELEASE     CHOLECYSTECTOMY     REPLACEMENT TOTAL KNEE      Prior to Admission medications   Medication Sig Start Date End Date Taking? Authorizing Provider  amLODipine (NORVASC) 10 MG tablet Take 10 mg by mouth daily.    [provider]  atorvastatin (LIPITOR) 20 MG tablet Take 20 mg by mouth daily.    [provider]  carbidopa-levodopa (SINEMET IR) 25-100 MG tablet Take 3 tablets by mouth 4 (four) times daily.    [provider]  Carbidopa-Levodopa ER (SINEMET CR) 25-100 MG tablet controlled release Take 1 tablet by mouth 2 (two) times daily.    [provider]  carvedilol (COREG) 25 MG tablet Take 25 mg by mouth 2 (two) times daily with a meal.    [provider]  doxycycline (VIBRAMYCIN) 100 MG capsule Take 1 capsule (100 mg total) by  mouth 2 (two) times daily. 11/06/20   Bridget Hartshorn L, PA-C  ipratropium (ATROVENT HFA) 17 MCG/ACT inhaler Inhale 2 puffs into the lungs 4 (four) times daily. 08/25/20 08/25/21  Merwyn Katos, MD  ipratropium-albuterol (DUONEB) 0.5-2.5 (3) MG/3ML SOLN Take 3 mLs by nebulization.    [provider]  lisinopril (ZESTRIL) 40 MG tablet Take 40 mg by mouth daily.    [provider]  predniSONE (DELTASONE) 10 MG tablet Take 5 tablets tomorrow, take 4 tablets on day 2, day 3 take 3 tablets, day 4 take 2 tablets, day 5 take 1 tablets 11/06/20   Tommi Rumps, PA-C    Allergies Patient has no known allergies.  No family history on file.  Social History Social History   Tobacco Use   Smoking status: Former    Pack years: 0.00   Smokeless tobacco: Never  Substance Use Topics   Alcohol use: Not Currently   Drug use: Not Currently      Review of Systems Constitutional: No fever/chills Eyes: No visual changes. ENT: No sore throat. Cardiovascular: Denies chest pain. Respiratory: Denies shortness of breath. Gastrointestinal: No abdominal pain.  No nausea, no vomiting.  No diarrhea.  No constipation. Genitourinary: Negative for dysuria. Musculoskeletal: Positive neck pain Skin: Negative for rash. Neurological: Positive headache, no focal weakness or numbness. All other ROS  negative ____________________________________________   PHYSICAL EXAM:  VITAL SIGNS: ED Triage Vitals  Enc Vitals Group     BP 12/10/20 1444 (!) 123/103     Pulse Rate 12/10/20 1444 72     Resp 12/10/20 1444 18     Temp 12/10/20 1444 98.4 F (36.9 C)     Temp Source 12/10/20 1444 Oral     SpO2 12/10/20 1444 93 %     Weight --      Height --      Head Circumference --      Peak Flow --      Pain Score 12/10/20 1439 8     Pain Loc --      Pain Edu? --      Excl. in GC? --     Constitutional: Alert and oriented. Well appearing and in no acute distress. Eyes: Conjunctivae are normal.  EOMI. Head: Atraumatic. Nose: No congestion/rhinnorhea. Mouth/Throat: Mucous membranes are moist.  Hematoma to the left forehead Neck: No stridor. Trachea Midline. FROM Cardiovascular: Normal rate, regular rhythm. Grossly normal heart sounds.  Good peripheral circulation. Respiratory: Normal respiratory effort.  No retractions. Lungs CTAB. Gastrointestinal: Soft and nontender. No distention. No abdominal bruits.  Musculoskeletal: No lower extremity tenderness nor edema.  No joint effusions. Neurologic:  Normal speech and language. No gross focal neurologic deficits are appreciated.  Tremor noted secondary to patient's Parkinson's.  Baseline for him. Skin:  Skin is warm, dry and intact. No rash noted. Psychiatric: Mood and affect are normal. Speech and behavior are normal. GU: Deferred  Back: Mild C-spine tenderness but more paraspinal in nature, no TL spine tenderness  ____________________________________________  _  RADIOLOGY   Official radiology report(s): CT Head Wo Contrast  Result Date: 12/10/2020 CLINICAL DATA:  Head injury, trauma, left temporal scalp hematoma EXAM: CT HEAD WITHOUT CONTRAST TECHNIQUE: Contiguous axial images were obtained from the base of the skull through the vertex without intravenous contrast. COMPARISON:  12/09/2020 FINDINGS: Brain: No evidence of acute infarction, hemorrhage, hydrocephalus, extra-axial collection or mass lesion/mass effect. Vascular: No hyperdense vessel or unexpected calcification. Skull: Normal. Negative for fracture or focal lesion. Sinuses/Orbits: No acute finding. Other: Similar small hyperdense left frontotemporal small scalp hematoma measuring 1.4 cm, image 15/2 IMPRESSION: No acute intracranial abnormality by noncontrast CT. Similar small left frontal scalp hematoma. Electronically Signed   By: Judie Petit.  Shick M.D.   On: 12/10/2020 16:12    ____________________________________________   PROCEDURES  Procedure(s) performed (including  Critical Care):  Procedures   ____________________________________________   INITIAL IMPRESSION / ASSESSMENT AND PLAN / ED COURSE  [facility]. was evaluated in [facility] on 12/10/2020 for the symptoms described in the history of present illness. He was evaluated in the context of the global COVID-19 pandemic, which necessitated consideration that the patient might be at risk for infection with the SARS-CoV-2 virus that causes COVID-19. Institutional protocols and algorithms that pertain to the evaluation of patients at risk for COVID-19 are in a state of rapid change based on information released by regulatory bodies including the CDC and federal and state organizations. These policies and algorithms were followed during the patient's care in the ED.    Patient is a well-appearing 60 year old with normal vital signs for slightly hypertensive who comes in with worsening headache and confusion after trauma yesterday.  I have low suspicion for acute intracranial hemorrhage given negative CT imaging yesterday because of the possibility of delayed bleed given patient's age.  We will get repeat CT  head to confirm no evidence of intercranial hemorrhage.  He does report some pain in his neck but seems more paraspinal in nature.  Already had a CT scan yesterday without any evidence of cervical fracture and I do not feel like repeat imaging is necessary today given patient is neuro intact otherwise.  His CT imaging is negative I suspect this is a mild concussion and will have patient continue Tylenol ibuprofen for pain and follow-up with his PCP         ____________________________________________   FINAL CLINICAL IMPRESSION(S) / ED DIAGNOSES   Final diagnoses:  Concussion without loss of consciousness, initial encounter      MEDICATIONS GIVEN DURING THIS VISIT:  Medications - No data to display   ED Discharge Orders     None        Note:  This document was prepared  using Dragon voice recognition software and may include unintentional dictation errors.    Concha Se, MD 12/10/20 [phone]

## 2020-12-10 NOTE — Discharge Instructions (Addendum)
Your repeat CT imaging was negative today I suspect that you have a mild concussion.  You can take Tylenol 1 g every 8 hours ibuprofen 400 every 6 hours with food to help with any discomfort for the next 7 days.  You can follow-up with your primary care doctor and return to the ER if your symptoms are worsening

## 2021-01-20 ENCOUNTER — Emergency Department: Payer: Medicare HMO

## 2021-01-20 ENCOUNTER — Inpatient Hospital Stay
Admission: EM | Admit: 2021-01-20 | Discharge: 2021-01-24 | DRG: 190 | Disposition: A | Discharge status: Home Health | Payer: Medicare HMO | Attending: Internal Medicine | Admitting: Internal Medicine

## 2021-01-20 DIAGNOSIS — Z87891 Personal history of nicotine dependence: Secondary | ICD-10-CM

## 2021-01-20 DIAGNOSIS — Z96659 Presence of unspecified artificial knee joint: Secondary | ICD-10-CM | POA: Diagnosis present

## 2021-01-20 DIAGNOSIS — Z9049 Acquired absence of other specified parts of digestive tract: Secondary | ICD-10-CM

## 2021-01-20 DIAGNOSIS — Z20822 Contact with and (suspected) exposure to covid-19: Secondary | ICD-10-CM | POA: Diagnosis present

## 2021-01-20 DIAGNOSIS — J441 Chronic obstructive pulmonary disease with (acute) exacerbation: Principal | ICD-10-CM | POA: Diagnosis present

## 2021-01-20 DIAGNOSIS — Z79899 Other long term (current) drug therapy: Secondary | ICD-10-CM

## 2021-01-20 DIAGNOSIS — I10 Essential (primary) hypertension: Secondary | ICD-10-CM | POA: Diagnosis present

## 2021-01-20 DIAGNOSIS — G20 Parkinson's disease: Secondary | ICD-10-CM | POA: Diagnosis present

## 2021-01-20 DIAGNOSIS — E1169 Type 2 diabetes mellitus with other specified complication: Secondary | ICD-10-CM | POA: Diagnosis present

## 2021-01-20 DIAGNOSIS — G20A1 Parkinson's disease without dyskinesia, without mention of fluctuations: Secondary | ICD-10-CM | POA: Diagnosis present

## 2021-01-20 DIAGNOSIS — J9601 Acute respiratory failure with hypoxia: Secondary | ICD-10-CM

## 2021-01-20 LAB — COMPREHENSIVE METABOLIC PANEL
ALT: <5 U/L (ref 0–44)
AST: 22 U/L (ref 15–41)
Albumin: 4.2 g/dL (ref 3.5–5.0)
Alkaline Phosphatase: 52 U/L (ref 38–126)
Anion gap: 10 (ref 5–15)
BUN: 17 mg/dL (ref 8–23)
CO2: 22 mmol/L (ref 22–32)
Calcium: 9.3 mg/dL (ref 8.9–10.3)
Chloride: 106 mmol/L (ref 98–111)
Creatinine, Ser: 1.09 mg/dL (ref 0.61–1.24)
GFR, Estimated: >60 mL/min (ref >60)
Glucose, Bld: 109 mg/dL — ABNORMAL HIGH (ref 70–99)
Potassium: 3.9 mmol/L (ref 3.5–5.1)
Sodium: 138 mmol/L (ref 135–145)
Total Bilirubin: 1.3 mg/dL — ABNORMAL HIGH (ref 0.3–1.2)
Total Protein: 7.6 g/dL (ref 6.5–8.1)

## 2021-01-20 LAB — CBC
HCT: 44.4 % (ref 39.0–52.0)
Hemoglobin: 15.8 g/dL (ref 13.0–17.0)
MCH: 34 pg (ref 26.0–34.0)
MCHC: 35.6 g/dL (ref 30.0–36.0)
MCV: 95.5 fL (ref 80.0–100.0)
Platelets: 296 10*3/uL (ref 150–400)
RBC: 4.65 MIL/uL (ref 4.22–5.81)
RDW: 13.5 % (ref 11.5–15.5)
WBC: 10.2 10*3/uL (ref 4.0–10.5)
nRBC: 0 % (ref 0.0–0.2)

## 2021-01-20 LAB — TROPONIN I (HIGH SENSITIVITY): Troponin I (High Sensitivity): 5 ng/L (ref <18)

## 2021-01-20 MED ORDER — METHYLPREDNISOLONE SODIUM SUCC 125 MG IJ SOLR
125.0000 mg | Freq: Once | INTRAMUSCULAR | Status: AC
  Administered 2021-01-20: 125 mg via INTRAVENOUS

## 2021-01-20 MED ORDER — MAGNESIUM SULFATE 2 GM/50ML IV SOLN
2.0000 g | Freq: Once | INTRAVENOUS | Status: AC
  Administered 2021-01-20: 2 g via INTRAVENOUS
  Filled 2021-01-20: qty 50

## 2021-01-20 MED ORDER — IPRATROPIUM-ALBUTEROL 0.5-2.5 (3) MG/3ML IN SOLN
3.0000 mL | Freq: Once | RESPIRATORY_TRACT | Status: AC
  Administered 2021-01-20: 3 mL via RESPIRATORY_TRACT
  Filled 2021-01-20: qty 3

## 2021-01-20 MED ORDER — METHYLPREDNISOLONE SODIUM SUCC 125 MG IJ SOLR
INTRAMUSCULAR | Status: AC
  Filled 2021-01-20: qty 2

## 2021-01-20 MED ORDER — LORAZEPAM 2 MG/ML IJ SOLN
INTRAMUSCULAR | Status: AC
  Administered 2021-01-20: 0.5 mg via INTRAVENOUS
  Filled 2021-01-20: qty 1

## 2021-01-20 MED ORDER — LORAZEPAM 2 MG/ML IJ SOLN: 0.5000 mg | Freq: Once | INTRAMUSCULAR | Status: AC

## 2021-01-20 NOTE — ED Notes (Addendum)
RT @ the bedside to apply BIPAP.

## 2021-01-20 NOTE — ED Triage Notes (Addendum)
Patient presents from home for difficulty breathing. Hx of asthma & parkinson's. Pt was 84% on RA - 90% on Non-rebreather mask and his O2 sats improved to 98% following 1 Duoneb treatment provided by EMS.  Pt states he used his PRN inhaler @ home and had 5 treatments over the course of the day without any improvement. Pt denies CP or any other concerns at this time.

## 2021-01-21 DIAGNOSIS — Z9049 Acquired absence of other specified parts of digestive tract: Secondary | ICD-10-CM | POA: Diagnosis not present

## 2021-01-21 DIAGNOSIS — G20 Parkinson's disease: Secondary | ICD-10-CM | POA: Diagnosis present

## 2021-01-21 DIAGNOSIS — Z96659 Presence of unspecified artificial knee joint: Secondary | ICD-10-CM | POA: Diagnosis present

## 2021-01-21 DIAGNOSIS — Z20822 Contact with and (suspected) exposure to covid-19: Secondary | ICD-10-CM | POA: Diagnosis present

## 2021-01-21 DIAGNOSIS — Z87891 Personal history of nicotine dependence: Secondary | ICD-10-CM | POA: Diagnosis not present

## 2021-01-21 DIAGNOSIS — J441 Chronic obstructive pulmonary disease with (acute) exacerbation: Principal | ICD-10-CM

## 2021-01-21 DIAGNOSIS — Z79899 Other long term (current) drug therapy: Secondary | ICD-10-CM | POA: Diagnosis not present

## 2021-01-21 DIAGNOSIS — I10 Essential (primary) hypertension: Secondary | ICD-10-CM | POA: Diagnosis present

## 2021-01-21 DIAGNOSIS — J9601 Acute respiratory failure with hypoxia: Secondary | ICD-10-CM | POA: Diagnosis present

## 2021-01-21 DIAGNOSIS — E1169 Type 2 diabetes mellitus with other specified complication: Secondary | ICD-10-CM | POA: Diagnosis present

## 2021-01-21 LAB — RESP PANEL BY RT-PCR (FLU A&B, COVID) ARPGX2
Influenza A by PCR: NEGATIVE
Influenza B by PCR: NEGATIVE
SARS Coronavirus 2 by RT PCR: NEGATIVE

## 2021-01-21 LAB — TROPONIN I (HIGH SENSITIVITY): Troponin I (High Sensitivity): 4 ng/L (ref <18)

## 2021-01-21 LAB — BRAIN NATRIURETIC PEPTIDE: B Natriuretic Peptide: 8.1 pg/mL (ref 0.0–100.0)

## 2021-01-21 LAB — HIV ANTIBODY (ROUTINE TESTING W REFLEX): HIV Screen 4th Generation wRfx: NONREACTIVE

## 2021-01-21 MED ORDER — ONDANSETRON HCL 4 MG PO TABS: 4.0000 mg | ORAL_TABLET | Freq: Four times a day (QID) | ORAL | Status: DC | PRN

## 2021-01-21 MED ORDER — LORAZEPAM 2 MG/ML IJ SOLN
0.5000 mg | Freq: Once | INTRAMUSCULAR | Status: AC
  Administered 2021-01-21: 0.5 mg via INTRAVENOUS
  Filled 2021-01-21: qty 1

## 2021-01-21 MED ORDER — AMLODIPINE BESYLATE 10 MG PO TABS
10.0000 mg | ORAL_TABLET | Freq: Every day | ORAL | Status: DC
  Administered 2021-01-21 – 2021-01-24 (×4): 10 mg via ORAL
  Filled 2021-01-21: qty 2
  Filled 2021-01-21 (×3): qty 1

## 2021-01-21 MED ORDER — ALBUTEROL SULFATE (2.5 MG/3ML) 0.083% IN NEBU
2.5000 mg | INHALATION_SOLUTION | RESPIRATORY_TRACT | Status: DC | PRN
  Administered 2021-01-22: 2.5 mg via RESPIRATORY_TRACT
  Filled 2021-01-21: qty 3

## 2021-01-21 MED ORDER — ACETAMINOPHEN 325 MG PO TABS
650.0000 mg | ORAL_TABLET | Freq: Four times a day (QID) | ORAL | Status: DC | PRN
  Administered 2021-01-21 (×2): 650 mg via ORAL
  Filled 2021-01-21 (×2): qty 2

## 2021-01-21 MED ORDER — CARBIDOPA-LEVODOPA 25-100 MG PO TABS
3.0000 | ORAL_TABLET | Freq: Once | ORAL | Status: AC
  Administered 2021-01-21: 3 via ORAL
  Filled 2021-01-21: qty 3

## 2021-01-21 MED ORDER — IPRATROPIUM-ALBUTEROL 0.5-2.5 (3) MG/3ML IN SOLN
3.0000 mL | RESPIRATORY_TRACT | Status: DC
  Administered 2021-01-21 – 2021-01-22 (×6): 3 mL via RESPIRATORY_TRACT
  Filled 2021-01-21 (×5): qty 3

## 2021-01-21 MED ORDER — CARVEDILOL 25 MG PO TABS
25.0000 mg | ORAL_TABLET | Freq: Two times a day (BID) | ORAL | Status: DC
  Administered 2021-01-21 – 2021-01-24 (×6): 25 mg via ORAL
  Filled 2021-01-21: qty 4
  Filled 2021-01-21 (×6): qty 1

## 2021-01-21 MED ORDER — ONDANSETRON HCL 4 MG/2ML IJ SOLN: 4.0000 mg | Freq: Four times a day (QID) | INTRAMUSCULAR | Status: DC | PRN

## 2021-01-21 MED ORDER — SODIUM CHLORIDE 0.9 % IV SOLN
500.0000 mg | INTRAVENOUS | Status: DC
  Administered 2021-01-21 – 2021-01-23 (×3): 500 mg via INTRAVENOUS
  Filled 2021-01-21 (×4): qty 500

## 2021-01-21 MED ORDER — CARBIDOPA-LEVODOPA 25-100 MG PO TABS
3.0000 | ORAL_TABLET | ORAL | Status: DC
  Administered 2021-01-21 – 2021-01-24 (×12): 3 via ORAL
  Filled 2021-01-21 (×15): qty 3

## 2021-01-21 MED ORDER — PREDNISONE 20 MG PO TABS
40.0000 mg | ORAL_TABLET | Freq: Every day | ORAL | Status: DC
  Administered 2021-01-22: 40 mg via ORAL
  Filled 2021-01-21: qty 2

## 2021-01-21 MED ORDER — CLONAZEPAM 0.25 MG PO TBDP
0.2500 mg | ORAL_TABLET | Freq: Every day | ORAL | Status: DC
  Administered 2021-01-21 – 2021-01-23 (×3): 0.5 mg via ORAL
  Filled 2021-01-21 (×3): qty 2

## 2021-01-21 MED ORDER — CARBIDOPA-LEVODOPA ER 25-100 MG PO TBCR
2.0000 | EXTENDED_RELEASE_TABLET | Freq: Every day | ORAL | Status: DC
  Filled 2021-01-21: qty 2

## 2021-01-21 MED ORDER — IPRATROPIUM-ALBUTEROL 0.5-2.5 (3) MG/3ML IN SOLN
3.0000 mL | Freq: Four times a day (QID) | RESPIRATORY_TRACT | Status: DC
  Administered 2021-01-21: 3 mL via RESPIRATORY_TRACT
  Filled 2021-01-21: qty 3

## 2021-01-21 MED ORDER — ENOXAPARIN SODIUM 40 MG/0.4ML IJ SOSY
40.0000 mg | PREFILLED_SYRINGE | INTRAMUSCULAR | Status: DC
  Administered 2021-01-21 – 2021-01-24 (×4): 40 mg via SUBCUTANEOUS
  Filled 2021-01-21 (×4): qty 0.4

## 2021-01-21 MED ORDER — CARBIDOPA-LEVODOPA 25-100 MG PO TABS
3.0000 | ORAL_TABLET | Freq: Four times a day (QID) | ORAL | Status: DC
  Filled 2021-01-21 (×3): qty 3

## 2021-01-21 MED ORDER — ACETAMINOPHEN 650 MG RE SUPP: 650.0000 mg | Freq: Four times a day (QID) | RECTAL | Status: DC | PRN

## 2021-01-21 MED ORDER — HYDROCODONE-ACETAMINOPHEN 5-325 MG PO TABS: 1.0000 | ORAL_TABLET | ORAL | Status: DC | PRN

## 2021-01-21 MED ORDER — METHYLPREDNISOLONE SODIUM SUCC 40 MG IJ SOLR
40.0000 mg | Freq: Two times a day (BID) | INTRAMUSCULAR | Status: AC
  Administered 2021-01-21 (×2): 40 mg via INTRAVENOUS
  Filled 2021-01-21 (×3): qty 1

## 2021-01-21 MED ORDER — CARBIDOPA-LEVODOPA ER 25-100 MG PO TBCR
1.0000 | EXTENDED_RELEASE_TABLET | Freq: Two times a day (BID) | ORAL | Status: AC
  Administered 2021-01-21 (×2): 1 via ORAL
  Filled 2021-01-21 (×2): qty 1

## 2021-01-21 MED ORDER — POLYETHYLENE GLYCOL 3350 17 G PO PACK
17.0000 g | PACK | Freq: Every day | ORAL | Status: DC
  Administered 2021-01-21 – 2021-01-24 (×4): 17 g via ORAL
  Filled 2021-01-21 (×4): qty 1

## 2021-01-21 MED ORDER — CARBIDOPA-LEVODOPA ER 25-100 MG PO TBCR
2.0000 | EXTENDED_RELEASE_TABLET | Freq: Every day | ORAL | Status: DC
  Administered 2021-01-22 – 2021-01-23 (×2): 2 via ORAL
  Filled 2021-01-21 (×3): qty 2

## 2021-01-21 NOTE — ED Notes (Signed)
Pt ambulatory to the restroom without assistance.  

## 2021-01-21 NOTE — Progress Notes (Signed)
PROGRESS NOTE    Jeremiah Taylor.  SKA:[id number] DOB: 19-May-1961 DOA: 01/20/2021 PCP: [facility]   Assessment & Plan:   Principal Problem:   COPD with acute exacerbation (HCC) Active Problems:   HTN (hypertension)   Parkinson's disease (HCC)   Type 2 diabetes mellitus with other specified complication (HCC)   Acute respiratory failure with hypoxia (HCC)   COPD exacerbation (HCC)    COPD exacerbation: continue on IV steroids, bronchodilators and supplemental oxygen. Started on azithromycin for its anti-inflammatory properties. Encourage incentive spirometry   Acute hypoxic respiratory failure: secondary to above. Continue on supplemental oxygen and wean as tolerated  HTN: continue on home dose of amlodipine, coreg     Parkinson's disease: continue on home dose of sinemet    DVT prophylaxis: lovenox  Code Status:  full  Family Communication: discussed pt's care w/ pt's wife, Mellody Life, and answered her questions  Disposition Plan: likely d/c back home   Level of care: Progressive Cardiac  Status is: Inpatient  Remains inpatient appropriate because:Unsafe d/c plan, IV treatments appropriate due to intensity of illness or inability to take PO, and Inpatient level of care appropriate due to severity of illness  Dispo: The patient is from: Home              Anticipated d/c is to: Home              Patient currently is not medically stable to d/c.   Difficult to place patient unclear     Consultants:   Procedures:   Antimicrobials: azithromycin     Subjective: Pt c/o rigidity from missing his sinemet doses and shortness of breath   Objective: Vitals:   01/21/21 0300 01/21/21 0315 01/21/21 0339 01/21/21 0800  BP: 109/73 115/79  (!) 126/92  Pulse: 75 74  78  Resp:    17  Temp:      TempSrc:      SpO2: 95% 97% 98% 97%  Weight:      Height:        Intake/Output Summary (Last 24 hours) at 01/21/2021 1444 Last data filed at 01/21/2021 0735 Gross per  24 hour  Intake 47.45 ml  Output 300 ml  Net -252.55 ml   Filed Weights   01/20/21 2311  Weight: 87.1 kg    Examination:  General exam: Appears calm but  uncomfortable  Respiratory system: course breath sounds b/l. Wheezes b/l  Cardiovascular system: S1 & S2 +. No  rubs, gallops or clicks. No pedal edema. Gastrointestinal system: Abdomen is nondistended, soft and nontender.  Normal bowel sounds heard. Central nervous system: Alert and oriented. Moves all extremities  Psychiatry: Judgement and insight appear normal. Flat mood and affect     Data Reviewed: I have personally reviewed following labs and imaging studies  CBC: Recent Labs  Lab 01/20/21 2311  WBC 10.2  HGB 15.8  HCT 44.4  MCV 95.5  PLT 296   Basic Metabolic Panel: Recent Labs  Lab 01/20/21 2311  NA 138  K 3.9  CL 106  CO2 22  GLUCOSE 109*  BUN 17  CREATININE 1.09  CALCIUM 9.3   GFR: Estimated Creatinine Clearance: 75.8 mL/min (by C-G formula based on SCr of 1.09 mg/dL). Liver Function Tests: Recent Labs  Lab 01/20/21 2311  AST 22  ALT <5  ALKPHOS 52  BILITOT 1.3*  PROT 7.6  ALBUMIN 4.2   No results for input(s): LIPASE, AMYLASE in the last 168 hours. No results for input(s): AMMONIA  in the last 168 hours. Coagulation Profile: No results for input(s): INR, PROTIME in the last 168 hours. Cardiac Enzymes: No results for input(s): CKTOTAL, CKMB, CKMBINDEX, TROPONINI in the last 168 hours. BNP (last 3 results) No results for input(s): PROBNP in the last 8760 hours. HbA1C: No results for input(s): HGBA1C in the last 72 hours. CBG: No results for input(s): GLUCAP in the last 168 hours. Lipid Profile: No results for input(s): CHOL, HDL, LDLCALC, TRIG, CHOLHDL, LDLDIRECT in the last 72 hours. Thyroid Function Tests: No results for input(s): TSH, T4TOTAL, FREET4, T3FREE, THYROIDAB in the last 72 hours. Anemia Panel: No results for input(s): VITAMINB12, FOLATE, FERRITIN, TIBC, IRON,  RETICCTPCT in the last 72 hours. Sepsis Labs: No results for input(s): PROCALCITON, LATICACIDVEN in the last 168 hours.  Recent Results (from the past 240 hour(s))  Resp Panel by RT-PCR (Flu A&B, Covid) Nasopharyngeal Swab     Status: None   Collection Time: 01/21/21 12:36 AM   Specimen: Nasopharyngeal Swab; Nasopharyngeal(NP) swabs in vial transport medium  Result Value Ref Range Status   SARS Coronavirus 2 by RT PCR NEGATIVE NEGATIVE Final    Comment: (NOTE) SARS-CoV-2 target nucleic acids are NOT DETECTED.  The SARS-CoV-2 RNA is generally detectable in upper respiratory specimens during the acute phase of infection. The lowest concentration of SARS-CoV-2 viral copies this assay can detect is 138 copies/mL. A negative result does not preclude SARS-Cov-2 infection and should not be used as the sole basis for treatment or other patient management decisions. A negative result may occur with  improper specimen collection/handling, submission of specimen other than nasopharyngeal swab, presence of viral mutation(s) within the areas targeted by this assay, and inadequate number of viral copies(<138 copies/mL). A negative result must be combined with clinical observations, patient history, and epidemiological information. The expected result is Negative.  Fact Sheet for Patients:  BloggerCourse.com  Fact Sheet for Healthcare Providers:  SeriousBroker.it  This test is no t yet approved or cleared by the [place] FDA and  has been authorized for detection and/or diagnosis of SARS-CoV-2 by FDA under an Emergency Use Authorization (EUA). This EUA will remain  in effect (meaning this test can be used) for the duration of the COVID-19 declaration under Section 564(b)(1) of the Act, 21 U.S.C.section 360bbb-3(b)(1), unless the authorization is terminated  or revoked sooner.       Influenza A by PCR NEGATIVE NEGATIVE Final   Influenza  B by PCR NEGATIVE NEGATIVE Final    Comment: (NOTE) The Xpert Xpress SARS-CoV-2/FLU/RSV plus assay is intended as an aid in the diagnosis of influenza from Nasopharyngeal swab specimens and should not be used as a sole basis for treatment. Nasal washings and aspirates are unacceptable for Xpert Xpress SARS-CoV-2/FLU/RSV testing.  Fact Sheet for Patients: BloggerCourse.com  Fact Sheet for Healthcare Providers: SeriousBroker.it  This test is not yet approved or cleared by the [place] FDA and has been authorized for detection and/or diagnosis of SARS-CoV-2 by FDA under an Emergency Use Authorization (EUA). This EUA will remain in effect (meaning this test can be used) for the duration of the COVID-19 declaration under Section 564(b)(1) of the Act, 21 U.S.C. section 360bbb-3(b)(1), unless the authorization is terminated or revoked.  Performed at [facility], [address], [place], [place] [address]          Radiology Studies: DG Chest Portable 1 View  Result Date: 01/20/2021 CLINICAL DATA:  Shortness of breath EXAM: PORTABLE CHEST 1 VIEW COMPARISON:  11/06/2020 FINDINGS:  The heart size and mediastinal contours are within normal limits. Both lungs are clear. The visualized skeletal structures are unremarkable. IMPRESSION: No active disease. Electronically Signed   By: Jasmine Pang M.D.   On: 01/20/2021 23:45        Scheduled Meds:  amLODipine  10 mg Oral Daily   carbidopa-levodopa  3 tablet Oral 4 times per day   Carbidopa-Levodopa ER  1 tablet Oral BID   [START ON 01/22/2021] Carbidopa-Levodopa ER  2 tablet Oral QHS   carvedilol  25 mg Oral BID WC   enoxaparin (LOVENOX) injection  40 mg Subcutaneous Q24H   ipratropium-albuterol  3 mL Nebulization Q4H   methylPREDNISolone (SOLU-MEDROL) injection  40 mg Intravenous Q12H   Followed by   Melene Muller ON 01/22/2021] predniSONE  40 mg Oral Q breakfast   Continuous  Infusions:  azithromycin       LOS: 0 days    Time spent: 34 mins     Charise Killian, MD [facility] Pager [phone]  If 7PM-7AM, please contact night-coverage 01/21/2021, 2:44 PM

## 2021-01-21 NOTE — ED Notes (Signed)
Pt declined to be on the monitor at this time but permits this RN to obtain VS when necessary.

## 2021-01-21 NOTE — ED Notes (Signed)
Pt provided crackers and apple juice.

## 2021-01-21 NOTE — H&P (Signed)
History and Physical    Langford Christen Butter. QMV:[id number] DOB: 1960-10-31 DOA: 01/20/2021  PCP: [facility]   Patient coming from: home  I have personally briefly reviewed patient's old medical records in [facility]  Chief Complaint: shortness of breath  HPI: Quinn Quam. is a 60 y.o. male with medical history significant for HTN, COPD, Parkinson's disease who presents to the ED with wheezing and shortness of breath that started on 8/8 not responding to home bronchodilator treatments.  He had no fever or chills or complaints of chest pain.  On arrival of EMS O2 sat was 84% and he was placed on CPAP.  ED course: On arrival afebrile, BP 138/98, pulse 92, respirations 28 with O2 sat 98% on CPAP.  He was transitioned to BiPAP on arrival Blood work: Troponin 5-4, BNP 8.1, CBC and CMP unremarkable  EKG: Sinus rhythm at 91 with no acute ST-T wave changes  Chest x-ray no active disease  Patient treated with IV Solu-Medrol duo nebs and IV magnesium with only slight improvement though he was able to transition off of BiPAP and is now needing O2 at 6 L.  Hospitalist consulted for admission. Review of Systems: As per HPI otherwise all other systems on review of systems negative.    Past Medical History:  Diagnosis Date   Asthma    Hypertension    Parkinson disease (HCC)     Past Surgical History:  Procedure Laterality Date   CARPAL TUNNEL RELEASE     CHOLECYSTECTOMY     REPLACEMENT TOTAL KNEE       reports that he has quit smoking. He has never used smokeless tobacco. He reports previous alcohol use. He reports previous drug use.  No Known Allergies  Family history: Deferred due to clinical condition   Prior to Admission medications   Medication Sig Start Date End Date Taking? Authorizing Provider  amLODipine (NORVASC) 10 MG tablet Take 10 mg by mouth daily.    [provider]  atorvastatin (LIPITOR) 20 MG tablet Take 20 mg by mouth daily.     [provider]  carbidopa-levodopa (SINEMET IR) 25-100 MG tablet Take 3 tablets by mouth 4 (four) times daily.    [provider]  Carbidopa-Levodopa ER (SINEMET CR) 25-100 MG tablet controlled release Take 1 tablet by mouth 2 (two) times daily.    [provider]  carvedilol (COREG) 25 MG tablet Take 25 mg by mouth 2 (two) times daily with a meal.    [provider]  doxycycline (VIBRAMYCIN) 100 MG capsule Take 1 capsule (100 mg total) by mouth 2 (two) times daily. 11/06/20   Bridget Hartshorn L, PA-C  ipratropium (ATROVENT HFA) 17 MCG/ACT inhaler Inhale 2 puffs into the lungs 4 (four) times daily. 08/25/20 08/25/21  Merwyn Katos, MD  ipratropium-albuterol (DUONEB) 0.5-2.5 (3) MG/3ML SOLN Take 3 mLs by nebulization.    [provider]  lisinopril (ZESTRIL) 40 MG tablet Take 40 mg by mouth daily.    [provider]  predniSONE (DELTASONE) 10 MG tablet Take 5 tablets tomorrow, take 4 tablets on day 2, day 3 take 3 tablets, day 4 take 2 tablets, day 5 take 1 tablets 11/06/20   Tommi Rumps, PA-C    Physical Exam: Vitals:   01/21/21 0115 01/21/21 0145 01/21/21 0215 01/21/21 0245  BP: (!) 138/94 115/80 134/79 114/81  Pulse: 79 77 75 76  Resp: 14 12 13 18   Temp:      TempSrc:  SpO2: 97% 97% 96% 96%  Weight:      Height:         Vitals:   01/21/21 0115 01/21/21 0145 01/21/21 0215 01/21/21 0245  BP: (!) 138/94 115/80 134/79 114/81  Pulse: 79 77 75 76  Resp: 14 12 13 18   Temp:      TempSrc:      SpO2: 97% 97% 96% 96%  Weight:      Height:          Constitutional: Somnolent but not in any apparent distress HEENT:      Head: Normocephalic and atraumatic.         Eyes: PERLA, EOMI, Conjunctivae are normal. Sclera is non-icteric.       Mouth/Throat: Mucous membranes are moist.       Neck: Supple with no signs of meningismus. Cardiovascular: Regular rate and rhythm. No murmurs, gallops, or rubs. 2+ symmetrical distal pulses  are present . No JVD. No LE edema Respiratory: Respiratory effort n increased with lung sounds diminished bilaterally.  Scattered rhonchi  gastrointestinal: Soft, non tender, and non distended with positive bowel sounds.  Genitourinary: No CVA tenderness. Musculoskeletal: Nontender with normal range of motion in all extremities. No cyanosis, or erythema of extremities. Neurologic:  Face is symmetric. Moving all extremities. No gross focal neurologic deficits . Skin: Skin is warm, dry.  No rash or ulcers Psychiatric: Mood and affect difficult to assess due to somnolence   Labs on Admission: I have personally reviewed following labs and imaging studies  CBC: Recent Labs  Lab 01/20/21 2311  WBC 10.2  HGB 15.8  HCT 44.4  MCV 95.5  PLT 296   Basic Metabolic Panel: Recent Labs  Lab 01/20/21 2311  NA 138  K 3.9  CL 106  CO2 22  GLUCOSE 109*  BUN 17  CREATININE 1.09  CALCIUM 9.3   GFR: Estimated Creatinine Clearance: 75.8 mL/min (by C-G formula based on SCr of 1.09 mg/dL). Liver Function Tests: Recent Labs  Lab 01/20/21 2311  AST 22  ALT <5  ALKPHOS 52  BILITOT 1.3*  PROT 7.6  ALBUMIN 4.2   No results for input(s): LIPASE, AMYLASE in the last 168 hours. No results for input(s): AMMONIA in the last 168 hours. Coagulation Profile: No results for input(s): INR, PROTIME in the last 168 hours. Cardiac Enzymes: No results for input(s): CKTOTAL, CKMB, CKMBINDEX, TROPONINI in the last 168 hours. BNP (last 3 results) No results for input(s): PROBNP in the last 8760 hours. HbA1C: No results for input(s): HGBA1C in the last 72 hours. CBG: No results for input(s): GLUCAP in the last 168 hours. Lipid Profile: No results for input(s): CHOL, HDL, LDLCALC, TRIG, CHOLHDL, LDLDIRECT in the last 72 hours. Thyroid Function Tests: No results for input(s): TSH, T4TOTAL, FREET4, T3FREE, THYROIDAB in the last 72 hours. Anemia Panel: No results for input(s): VITAMINB12, FOLATE,  FERRITIN, TIBC, IRON, RETICCTPCT in the last 72 hours. Urine analysis: No results found for: COLORURINE, APPEARANCEUR, LABSPEC, PHURINE, GLUCOSEU, HGBUR, BILIRUBINUR, KETONESUR, PROTEINUR, UROBILINOGEN, NITRITE, LEUKOCYTESUR  Radiological Exams on Admission: DG Chest Portable 1 View  Result Date: 01/20/2021 CLINICAL DATA:  Shortness of breath EXAM: PORTABLE CHEST 1 VIEW COMPARISON:  11/06/2020 FINDINGS: The heart size and mediastinal contours are within normal limits. Both lungs are clear. The visualized skeletal structures are unremarkable. IMPRESSION: No active disease. Electronically Signed   By: 11/08/2020 M.D.   On: 01/20/2021 23:45     Assessment/Plan 60 year old male with history of HTN, COPD,  Parkinson's disease who presents to the ED with wheezing and shortness of breath that started on 8/8 not responding to home bronchodilator treatments.  He had no fever or chills or complaints of chest pain.  On arrival of EMS O2 sat was 84% and he was placed on CPAP.    COPD with acute exacerbation (HCC)   Acute respiratory failure with hypoxia (HCC) - Patient tachypneic and hypoxic to the 80s with with EMS requiring CPAP transitioned to BiPAP on arrival now on O2 at 6 L - Chest x-ray clear, BNP normal, COVID and flu negative -Follow D-dimer - Scheduled and as needed nebulized bronchodilator treatments - IV steroids - Mucolytic's - Continue supplemental oxygen to keep sats over 90 and wean as tolerated    HTN (hypertension) - Continue amlodipine pending med rec    Parkinson's disease (HCC) - Continue Sinemet.  Med rec pending       DVT prophylaxis: Lovenox  Code Status: full code  Family Communication:  none  Disposition Plan: Back to previous home environment Consults called: none  Status:At the time of admission, it appears that the appropriate admission status for this patient is INPATIENT. This is judged to be reasonable and necessary in order to provide the required intensity  of service to ensure the patient's safety given the presenting symptoms, physical exam findings, and initial radiographic and laboratory data in the context of their  Comorbid conditions.   Patient requires inpatient status due to high intensity of service, high risk for further deterioration and high frequency of surveillance required.   I certify that at the point of admission it is my clinical judgment that the patient will require inpatient hospital care spanning beyond 2 midnights     Andris Baumann MD [facility]     01/21/2021, 3:14 AM

## 2021-01-21 NOTE — ED Provider Notes (Addendum)
[facility] Emergency Department Provider Note  Time seen: 12:18 AM  I have reviewed the triage vital signs and the nursing notes.   HISTORY  Chief Complaint Respiratory Distress   HPI Jeremiah Taylor. is a 60 y.o. male with a past medical history of asthma, hypertension, Parkinson's, presents to the emergency department for worsening shortness of breath.  According to the wife since afternoon patient has had severe shortness of breath that has worsened.  EMS states patient was satting 84% on their arrival.  Placed the patient on CPAP and brought to the emergency department for further evaluation.  Here the patient continues to state shortness of breath, breathing around 40 times per minute has diffuse expiratory wheeze on examination.  Patient is tremulous but has a history of Parkinson's disease.  Patient placed on BiPAP upon arrival.  Patient does state mild central chest pain.  Denies any known fever.   Past Medical History:  Diagnosis Date   Asthma    Hypertension    Parkinson disease (HCC)     There are no problems to display for this patient.   Past Surgical History:  Procedure Laterality Date   CARPAL TUNNEL RELEASE     CHOLECYSTECTOMY     REPLACEMENT TOTAL KNEE      Prior to Admission medications   Medication Sig Start Date End Date Taking? Authorizing Provider  amLODipine (NORVASC) 10 MG tablet Take 10 mg by mouth daily.    [provider]  atorvastatin (LIPITOR) 20 MG tablet Take 20 mg by mouth daily.    [provider]  carbidopa-levodopa (SINEMET IR) 25-100 MG tablet Take 3 tablets by mouth 4 (four) times daily.    [provider]  Carbidopa-Levodopa ER (SINEMET CR) 25-100 MG tablet controlled release Take 1 tablet by mouth 2 (two) times daily.    [provider]  carvedilol (COREG) 25 MG tablet Take 25 mg by mouth 2 (two) times daily with a meal.    [provider]  doxycycline (VIBRAMYCIN) 100  MG capsule Take 1 capsule (100 mg total) by mouth 2 (two) times daily. 11/06/20   Bridget Hartshorn L, PA-C  ipratropium (ATROVENT HFA) 17 MCG/ACT inhaler Inhale 2 puffs into the lungs 4 (four) times daily. 08/25/20 08/25/21  Merwyn Katos, MD  ipratropium-albuterol (DUONEB) 0.5-2.5 (3) MG/3ML SOLN Take 3 mLs by nebulization.    [provider]  lisinopril (ZESTRIL) 40 MG tablet Take 40 mg by mouth daily.    [provider]  predniSONE (DELTASONE) 10 MG tablet Take 5 tablets tomorrow, take 4 tablets on day 2, day 3 take 3 tablets, day 4 take 2 tablets, day 5 take 1 tablets 11/06/20   Bridget Hartshorn L, PA-C    No Known Allergies  No family history on file.  Social History Social History   Tobacco Use   Smoking status: Former   Smokeless tobacco: Never  Substance Use Topics   Alcohol use: Not Currently   Drug use: Not Currently    Review of Systems Constitutional: Negative for fever. Cardiovascular: Mild central chest pain. Respiratory: Significant shortness of breath.  Positive for wheeze. Gastrointestinal: Negative for abdominal pain, vomiting Musculoskeletal: Negative for leg pain or swelling. Neurological: Negative for headache All other ROS negative  ____________________________________________   PHYSICAL EXAM:  VITAL SIGNS: ED Triage Vitals  Enc Vitals Group     BP 01/20/21 2314 (!) 138/98     Pulse Rate 01/20/21 2310 92     Resp  01/20/21 2310 (!) 28     Temp 01/20/21 2314 98.2 F (36.8 C)     Temp Source 01/20/21 2314 Oral     SpO2 01/20/21 2310 98 %     Weight 01/20/21 2311 192 lb (87.1 kg)     Height 01/20/21 2311 5\' 11"  (1.803 m)     Head Circumference --      Peak Flow --      Pain Score 01/20/21 2309 0     Pain Loc --      Pain Edu? --      Excl. in GC? --    Constitutional: Alert and oriented.  Moderate respiratory distress. Eyes: Normal exam ENT      Head: Normocephalic and atraumatic.      Mouth/Throat: Mucous membranes are  moist. Cardiovascular: Normal rate, regular rhythm around 100 bpm. Respiratory: Significant tachypnea between 30 and 40 breaths/min with increased respiratory effort.  Patient has diffuse expiratory wheeze but is moving a decent amount of air currently on BiPAP. Gastrointestinal: Soft and nontender. No distention.  Musculoskeletal: Nontender with normal range of motion in all extremities. No lower extremity tenderness or edema. Neurologic:  Normal speech and language. No gross focal neurologic deficits  Skin:  Skin is warm, dry and intact.  Psychiatric: Mood and affect are normal.   ____________________________________________    EKG  EKG viewed and interpreted by myself shows a normal sinus rhythm at 91 bpm with a narrow QRS, normal axis, normal intervals, nonspecific ST changes.  Do not agree with computer interpretation of MI, patient has Parkinson's making interpretation somewhat more difficult.  ____________________________________________    RADIOLOGY  Chest x-ray negative  ____________________________________________   INITIAL IMPRESSION / ASSESSMENT AND PLAN / ED COURSE  Pertinent labs & imaging results that were available during my care of the patient were reviewed by me and considered in my medical decision making (see chart for details).   Patient presents to the emergency department for shortness of breath.  Patient found to be quite tachypneic placed on BiPAP upon arrival.  Patient has diffuse expiratory wheeze, he does have tremor due to Parkinson's.  Patient is moving a decent amount of air currently on BiPAP.  Patient denies any history of COPD but states he is a longtime smoker recently quit approximately 6 months ago, does have a history of asthma.  Patient received nebulizer by EMS.  We will dose 3 additional DuoNeb's we will dose Solu-Medrol, IV magnesium and continue to closely monitor.  We will check labs, chest x-ray.  We will dose a small amount of Ativan to help  with patient's anxiety and hopefully help with his respiratory discomfort/distress.  Chest x-ray is negative.  Lab work largely within normal limits.  No acute abnormality.  Was able to titrate off of BiPAP now on 6 L nasal cannula.  Patient will be admitted to the hospital service for further work-up and treatment.  Andra Dushaun Okey. was evaluated in Emergency Department on 01/21/2021 for the symptoms described in the history of present illness. He was evaluated in the context of the global COVID-19 pandemic, which necessitated consideration that the patient might be at risk for infection with the SARS-CoV-2 virus that causes COVID-19. Institutional protocols and algorithms that pertain to the evaluation of patients at risk for COVID-19 are in a state of rapid change based on information released by regulatory bodies including the CDC and federal and state organizations. These policies and algorithms were followed during the patient's care in  the ED.  CRITICAL CARE Performed by: Minna Antis   Total critical care time: 30 minutes  Critical care time was exclusive of separately billable procedures and treating other patients.  Critical care was necessary to treat or prevent imminent or life-threatening deterioration.  Critical care was time spent personally by me on the following activities: development of treatment plan with patient and/or surrogate as well as nursing, discussions with consultants, evaluation of patient's response to treatment, examination of patient, obtaining history from patient or surrogate, ordering and performing treatments and interventions, ordering and review of laboratory studies, ordering and review of radiographic studies, pulse oximetry and re-evaluation of patient's condition.  ____________________________________________   FINAL CLINICAL IMPRESSION(S) / ED DIAGNOSES  Dyspnea COPD exacerbation Asthma   Minna Antis, MD 01/21/21 7619     Minna Antis, MD 01/21/21 [phone]

## 2021-01-21 NOTE — ED Notes (Signed)
Pt requesting to come off the BiPAP - MD made aware and patient transitioned to 6L [place].

## 2021-01-22 ENCOUNTER — Other Ambulatory Visit: Payer: Self-pay

## 2021-01-22 DIAGNOSIS — J9601 Acute respiratory failure with hypoxia: Secondary | ICD-10-CM

## 2021-01-22 DIAGNOSIS — G20 Parkinson's disease: Secondary | ICD-10-CM

## 2021-01-22 LAB — CBC
HCT: 41.5 % (ref 39.0–52.0)
Hemoglobin: 14.7 g/dL (ref 13.0–17.0)
MCH: 34.3 pg — ABNORMAL HIGH (ref 26.0–34.0)
MCHC: 35.4 g/dL (ref 30.0–36.0)
MCV: 97 fL (ref 80.0–100.0)
Platelets: 267 10*3/uL (ref 150–400)
RBC: 4.28 MIL/uL (ref 4.22–5.81)
RDW: 13.7 % (ref 11.5–15.5)
WBC: 11 10*3/uL — ABNORMAL HIGH (ref 4.0–10.5)
nRBC: 0 % (ref 0.0–0.2)

## 2021-01-22 LAB — BASIC METABOLIC PANEL
Anion gap: 8 (ref 5–15)
BUN: 16 mg/dL (ref 8–23)
CO2: 26 mmol/L (ref 22–32)
Calcium: 9.1 mg/dL (ref 8.9–10.3)
Chloride: 102 mmol/L (ref 98–111)
Creatinine, Ser: 0.79 mg/dL (ref 0.61–1.24)
GFR, Estimated: >60 mL/min (ref >60)
Glucose, Bld: 196 mg/dL — ABNORMAL HIGH (ref 70–99)
Potassium: 4.2 mmol/L (ref 3.5–5.1)
Sodium: 136 mmol/L (ref 135–145)

## 2021-01-22 LAB — TROPONIN I (HIGH SENSITIVITY)
Troponin I (High Sensitivity): 5 ng/L (ref <18)
Troponin I (High Sensitivity): 4 ng/L (ref <18)

## 2021-01-22 LAB — GLUCOSE, CAPILLARY: Glucose-Capillary: 138 mg/dL — ABNORMAL HIGH (ref 70–99)

## 2021-01-22 MED ORDER — METHYLPREDNISOLONE SODIUM SUCC 40 MG IJ SOLR
40.0000 mg | Freq: Every day | INTRAMUSCULAR | Status: DC
  Administered 2021-01-23 – 2021-01-24 (×2): 40 mg via INTRAVENOUS
  Filled 2021-01-22 (×2): qty 1

## 2021-01-22 MED ORDER — CLONAZEPAM 0.5 MG PO TABS: 0.2500 mg | ORAL_TABLET | Freq: Two times a day (BID) | ORAL | Status: DC | PRN

## 2021-01-22 MED ORDER — IPRATROPIUM-ALBUTEROL 0.5-2.5 (3) MG/3ML IN SOLN: 3.0000 mL | Freq: Two times a day (BID) | RESPIRATORY_TRACT | Status: DC

## 2021-01-22 MED ORDER — CLONAZEPAM 0.25 MG PO TBDP
0.2500 mg | ORAL_TABLET | Freq: Two times a day (BID) | ORAL | Status: DC | PRN
  Administered 2021-01-22 – 2021-01-23 (×2): 0.25 mg via ORAL
  Filled 2021-01-22 (×3): qty 1

## 2021-01-22 MED ORDER — ALPRAZOLAM 0.25 MG PO TABS: 0.2500 mg | ORAL_TABLET | Freq: Every day | ORAL | Status: DC | PRN

## 2021-01-22 MED ORDER — NITROGLYCERIN 0.4 MG SL SUBL
0.4000 mg | SUBLINGUAL_TABLET | SUBLINGUAL | Status: DC | PRN
  Administered 2021-01-22: 0.4 mg via SUBLINGUAL
  Filled 2021-01-22: qty 1

## 2021-01-22 MED ORDER — IPRATROPIUM-ALBUTEROL 0.5-2.5 (3) MG/3ML IN SOLN
3.0000 mL | Freq: Four times a day (QID) | RESPIRATORY_TRACT | Status: DC
  Administered 2021-01-22 – 2021-01-24 (×8): 3 mL via RESPIRATORY_TRACT
  Filled 2021-01-22 (×10): qty 3

## 2021-01-22 NOTE — ED Notes (Signed)
Pts 0500 labs to be obtained by phlebotomy.  

## 2021-01-22 NOTE — Evaluation (Signed)
Occupational Therapy Evaluation Patient Details Name: Jeremiah Taylor. MRN: [id number] DOB: 1960-09-20 Today's Date: 01/22/2021    History of Present Illness Pt is a 60 y.o. male with medical history significant for HTN, COPD, Parkinson's disease who presents to the ED with wheezing and shortness of breath that started on 8/8 not responding to home bronchodilator treatments.   Clinical Impression   Pt seen for OT evaluation this date in setting of acute hospitalization d/t SOB. Pt presents this date reporting his breathing has improved and his O2 is noted to be >95% throughout session (at rest and with mobility) on RA. Pt reports being INDEP/MOD I for basic self care at baseline. States he requires assist from his spouse for most IADLs including cooking, but that he is able to contribute to laundry. Pt reports that he is able to walk in the home and in community with no AD, but states he sometimes needs spousal help for community mobility. Pt reports that his wife works two jobs and he is mostly on his own during the day. Pt presents this date with some decreased fxl activity tolerance, decreased balance and reports increased tremors, impacting his ability to safely and efficiently perform ADLs/ADL mobility. On OT assessment, pt requires: SETUP for some UB ADLs, but MIN A for things that recruit more [facility] skills like setting up his meal tray. Pt requires MIN A for seated LB ADLs (endorses some baseline difficulty with R LE). Pt able to perform ADL transfers with no AD and CGA for stability, but demos good strength for lift off and controlling his descent to sitting. Could potentially benefit from AD for balance. Pt engaged in toileting tasks during session with SBA/CGA and returned to bed at end of session with bed alarm on. Will continue to follow acutely. Anticipate pt will require HHOT f/u for safety with self care in the natural environment.    Follow Up Recommendations  Home health OT     Equipment Recommendations  3 in 1 bedside commode;Tub/shower bench;Other (comment) (grab bars and removable shower head in shower)    Recommendations for Other Services       Precautions / Restrictions Precautions Precautions: Fall Restrictions Weight Bearing Restrictions: No      Mobility Bed Mobility Overal bed mobility: Needs Assistance Bed Mobility: Supine to Sit;Sit to Supine     Supine to sit: Supervision;HOB elevated Sit to supine: Supervision;HOB elevated   General bed mobility comments: increased time, c/o increased tremors at this time 2/2 albuterol medication    Transfers Overall transfer level: Needs assistance Equipment used: None Transfers: Sit to/from Stand Sit to Stand: Min guard         General transfer comment: CGA for stabilizing d/t tremors. Pt with good strength for transfers.    Balance Overall balance assessment: Needs assistance Sitting-balance support: Feet supported Sitting balance-Leahy Scale: Good       Standing balance-Leahy Scale: Fair Standing balance comment: While pt's stability and balance are good, he does have some near-misses with LOB requiring correction for safety from OT. Primarily unsteady 2/2 tremoring which he reports is more pronounced at time of OT Assessment than his baseline and attributes more exaggerated tremors to recieving albuterol.                           ADL either performed or assessed with clinical judgement   ADL Overall ADL's : Needs assistance/impaired  General ADL Comments: requires SETUP for some UB ADLs, but MIN A for things that recruit more [facility] skills like setting up his meal tray. Pt requires MIN A for seated LB ADLs (endorses some baseline difficulty with R LE). Pt able to perform ADL transfers with no AD and CGA for stability, but demos good strength for lift off and controlling his descent to sitting. Could potentiall benefit from  AD for balance.     Vision Patient Visual Report: No change from baseline       Perception     Praxis      Pertinent Vitals/Pain Pain Assessment: Faces Faces Pain Scale: No hurt Pain Location: did report increasing leg pain with ambulation Pain Descriptors / Indicators: Aching;Sore Pain Intervention(s): Limited activity within patient's tolerance;Monitored during session;Repositioned     Hand Dominance     Extremity/Trunk Assessment Upper Extremity Assessment Upper Extremity Assessment: Overall WFL for tasks assessed (ROM and strength WFL, tremors noted to impede UB ADLs, esp FMC tasks such as fastening buttons/ties on clothing. States he mostly wears elastic waist pants and slip on shoes at baseline.)   Lower Extremity Assessment Lower Extremity Assessment: Defer to PT evaluation RLE Deficits / Details: states his  R LE is his weaker one, endorses some baseline difficulty getting socks on R foot and threading pants/underwear over R LE. LLE Deficits / Details: grossly 4/5   Cervical / Trunk Assessment Cervical / Trunk Assessment: Other exceptions Cervical / Trunk Exceptions: Patient with rhythmic constant movement in trunk/neck and UEs throughout session   Communication Communication Communication: No difficulties   Cognition Arousal/Alertness: Awake/alert Behavior During Therapy: WFL for tasks assessed/performed Overall Cognitive Status: Within Functional Limits for tasks assessed                                 General Comments: A&Ox4, kind and participatory   General Comments       Exercises Other Exercises Other Exercises: OT ed re: role of OT in acute setting. Pt with good understanding.   Shoulder Instructions      Home Living Family/patient expects to be discharged to:: Private residence Living Arrangements: Spouse/significant other Available Help at Discharge: Family Type of Home: House Home Access: Stairs to enter Secretary/administrator  of Steps: 13 Entrance Stairs-Rails: Left Home Layout: One level               Home Equipment: None          Prior Functioning/Environment Level of Independence: Needs assistance  Gait / Transfers Assistance Needed: independent has had 3 falls ADL's / Homemaking Assistance Needed: IADLs mostly performed by wife, pt stated he minimally performs them, depends largely on severity of his tremors. States he can start loads of laundry, but is more restricted with things like cooking d/t sharps and hot surfaces.            OT Problem List: Decreased strength;Decreased activity tolerance;Decreased coordination      OT Treatment/Interventions: Self-care/ADL training;Therapeutic activities;Therapeutic exercise    OT Goals(Current goals can be found in the care plan section) Acute Rehab OT Goals Patient Stated Goal: to go home when he feels better OT Goal Formulation: With patient Time For Goal Achievement: 02/05/21 Potential to Achieve Goals: Good ADL Goals Pt Will Perform Eating: with supervision;with adaptive utensils (weighted utensils versus U-cuff as needed) Pt Will Perform Grooming: with set-up;sitting;with adaptive equipment (with enlarged grip versus u-cuff as needed for  tooth-brush/comb, etc) Pt Will Transfer to Toilet: with modified independence;ambulating (with LRAD PRN, to/from restroom with <10% incidence of sway/near LOB) Pt Will Perform Toileting - Clothing Manipulation and hygiene: with modified independence;sit to/from stand (with LRAD as needed including grab bars and elevated commode PRN)  OT Frequency: Min 1X/week   Barriers to D/C:            Co-evaluation              AM-PAC OT "6 Clicks" Daily Activity     Outcome Measure Help from another person eating meals?: A Little Help from another person taking care of personal grooming?: A Little Help from another person toileting, which includes using toliet, bedpan, or urinal?: A Little Help from  another person bathing (including washing, rinsing, drying)?: A Little Help from another person to put on and taking off regular upper body clothing?: A Little Help from another person to put on and taking off regular lower body clothing?: A Little 6 Click Score: 18   End of Session Equipment Utilized During Treatment: Gait belt Nurse Communication: Mobility status  Activity Tolerance: Patient tolerated treatment well Patient left: with call bell/phone within reach;with bed alarm set;in bed  OT Visit Diagnosis: Unsteadiness on feet (R26.81);Other abnormalities of gait and mobility (R26.89);Other symptoms and signs involving the nervous system (R29.898)                Time: 1455-1520 OT Time Calculation (min): 25 min Charges:  OT General Charges $OT Visit: 1 Visit OT Evaluation $OT Eval Low Complexity: 1 Low OT Treatments $Self Care/Home Management : 8-22 mins  Rejeana Brock, MS, OTR/L ascom [phone] 01/22/21, 5:04 PM

## 2021-01-22 NOTE — Progress Notes (Signed)
PROGRESS NOTE    Jeremiah Taylor.  MBE:[id number] DOB: 1961/05/13 DOA: 01/20/2021 PCP: [facility]   Assessment & Plan:   Principal Problem:   COPD with acute exacerbation (HCC) Active Problems:   HTN (hypertension)   Parkinson's disease (HCC)   Type 2 diabetes mellitus with other specified complication (HCC)   Acute respiratory failure with hypoxia (HCC)   COPD exacerbation (HCC)    COPD exacerbation: still w/ wheezing today. Continue on azithromycin, IV steroids, bronchodilators and supplemental oxygen. Encourage incentive spirometry   Acute hypoxic respiratory failure: secondary to above. Continue on supplemental oxygen and wean as tolerated  HTN: continue on home dose of CCB, BB    Parkinson's disease: continue on home dose of sinemet. Klonopin prn. PT/OT consulted     DVT prophylaxis: lovenox  Code Status:  full  Family Communication:  Disposition Plan: likely d/c back home   Level of care: Progressive Cardiac  Status is: Inpatient  Remains inpatient appropriate because:Unsafe d/c plan, IV treatments appropriate due to intensity of illness or inability to take PO, and Inpatient level of care appropriate due to severity of illness  Dispo: The patient is from: Home              Anticipated d/c is to: Home              Patient currently is not medically stable to d/c.   Difficult to place patient unclear     Consultants:   Procedures:   Antimicrobials: azithromycin     Subjective: Pt still c/o wheezing   Objective: Vitals:   01/22/21 0100 01/22/21 0400 01/22/21 0618 01/22/21 0800  BP: 128/77 119/72 129/79 (!) 152/97  Pulse: 70 78 90 68  Resp: 18 20 16 18   Temp:      TempSrc:      SpO2: 100% 97% 98% 99%  Weight:      Height:        Intake/Output Summary (Last 24 hours) at 01/22/2021 0839 Last data filed at 01/21/2021 1732 Gross per 24 hour  Intake 249.84 ml  Output --  Net 249.84 ml   Filed Weights   01/20/21 2311  Weight:  87.1 kg    Examination:  General exam: Appears uncomfortable Respiratory system: course breath sounds b/l. Wheezes b/l  Cardiovascular system: S1/S2+. No rubs or clicks  Gastrointestinal system: Abd is soft, NT, ND & normal bowel sounds  Central nervous system: Alert and oriented. Moves all extremities  Psychiatry: Judgement and insight appear normal. Flat mood and affect    Data Reviewed: I have personally reviewed following labs and imaging studies  CBC: Recent Labs  Lab 01/20/21 2311 01/22/21 0612  WBC 10.2 11.0*  HGB 15.8 14.7  HCT 44.4 41.5  MCV 95.5 97.0  PLT 296 267   Basic Metabolic Panel: Recent Labs  Lab 01/20/21 2311 01/22/21 0612  NA 138 136  K 3.9 4.2  CL 106 102  CO2 22 26  GLUCOSE 109* 196*  BUN 17 16  CREATININE 1.09 0.79  CALCIUM 9.3 9.1   GFR: Estimated Creatinine Clearance: 103.3 mL/min (by C-G formula based on SCr of 0.79 mg/dL). Liver Function Tests: Recent Labs  Lab 01/20/21 2311  AST 22  ALT <5  ALKPHOS 52  BILITOT 1.3*  PROT 7.6  ALBUMIN 4.2   No results for input(s): LIPASE, AMYLASE in the last 168 hours. No results for input(s): AMMONIA in the last 168 hours. Coagulation Profile: No results for input(s): INR, PROTIME in  the last 168 hours. Cardiac Enzymes: No results for input(s): CKTOTAL, CKMB, CKMBINDEX, TROPONINI in the last 168 hours. BNP (last 3 results) No results for input(s): PROBNP in the last 8760 hours. HbA1C: No results for input(s): HGBA1C in the last 72 hours. CBG: No results for input(s): GLUCAP in the last 168 hours. Lipid Profile: No results for input(s): CHOL, HDL, LDLCALC, TRIG, CHOLHDL, LDLDIRECT in the last 72 hours. Thyroid Function Tests: No results for input(s): TSH, T4TOTAL, FREET4, T3FREE, THYROIDAB in the last 72 hours. Anemia Panel: No results for input(s): VITAMINB12, FOLATE, FERRITIN, TIBC, IRON, RETICCTPCT in the last 72 hours. Sepsis Labs: No results for input(s): PROCALCITON,  LATICACIDVEN in the last 168 hours.  Recent Results (from the past 240 hour(s))  Resp Panel by RT-PCR (Flu A&B, Covid) Nasopharyngeal Swab     Status: None   Collection Time: 01/21/21 12:36 AM   Specimen: Nasopharyngeal Swab; Nasopharyngeal(NP) swabs in vial transport medium  Result Value Ref Range Status   SARS Coronavirus 2 by RT PCR NEGATIVE NEGATIVE Final    Comment: (NOTE) SARS-CoV-2 target nucleic acids are NOT DETECTED.  The SARS-CoV-2 RNA is generally detectable in upper respiratory specimens during the acute phase of infection. The lowest concentration of SARS-CoV-2 viral copies this assay can detect is 138 copies/mL. A negative result does not preclude SARS-Cov-2 infection and should not be used as the sole basis for treatment or other patient management decisions. A negative result may occur with  improper specimen collection/handling, submission of specimen other than nasopharyngeal swab, presence of viral mutation(s) within the areas targeted by this assay, and inadequate number of viral copies(<138 copies/mL). A negative result must be combined with clinical observations, patient history, and epidemiological information. The expected result is Negative.  Fact Sheet for Patients:  BloggerCourse.com  Fact Sheet for Healthcare Providers:  SeriousBroker.it  This test is no t yet approved or cleared by the [place] FDA and  has been authorized for detection and/or diagnosis of SARS-CoV-2 by FDA under an Emergency Use Authorization (EUA). This EUA will remain  in effect (meaning this test can be used) for the duration of the COVID-19 declaration under Section 564(b)(1) of the Act, 21 U.S.C.section 360bbb-3(b)(1), unless the authorization is terminated  or revoked sooner.       Influenza A by PCR NEGATIVE NEGATIVE Final   Influenza B by PCR NEGATIVE NEGATIVE Final    Comment: (NOTE) The Xpert Xpress  SARS-CoV-2/FLU/RSV plus assay is intended as an aid in the diagnosis of influenza from Nasopharyngeal swab specimens and should not be used as a sole basis for treatment. Nasal washings and aspirates are unacceptable for Xpert Xpress SARS-CoV-2/FLU/RSV testing.  Fact Sheet for Patients: BloggerCourse.com  Fact Sheet for Healthcare Providers: SeriousBroker.it  This test is not yet approved or cleared by the [place] FDA and has been authorized for detection and/or diagnosis of SARS-CoV-2 by FDA under an Emergency Use Authorization (EUA). This EUA will remain in effect (meaning this test can be used) for the duration of the COVID-19 declaration under Section 564(b)(1) of the Act, 21 U.S.C. section 360bbb-3(b)(1), unless the authorization is terminated or revoked.  Performed at [facility], [address], [place], [place] [address]          Radiology Studies: DG Chest Portable 1 View  Result Date: 01/20/2021 CLINICAL DATA:  Shortness of breath EXAM: PORTABLE CHEST 1 VIEW COMPARISON:  11/06/2020 FINDINGS: The heart size and mediastinal contours are within normal limits. Both lungs are clear.  The visualized skeletal structures are unremarkable. IMPRESSION: No active disease. Electronically Signed   By: Jasmine Pang M.D.   On: 01/20/2021 23:45        Scheduled Meds:  amLODipine  10 mg Oral Daily   carbidopa-levodopa  3 tablet Oral 4 times per day   Carbidopa-Levodopa ER  2 tablet Oral QHS   carvedilol  25 mg Oral BID WC   clonazePAM  0.25-0.5 mg Oral QHS   enoxaparin (LOVENOX) injection  40 mg Subcutaneous Q24H   ipratropium-albuterol  3 mL Nebulization Q4H   polyethylene glycol  17 g Oral Daily   predniSONE  40 mg Oral Q breakfast   Continuous Infusions:  azithromycin Stopped (01/21/21 1732)     LOS: 1 day    Time spent: 31 mins     Charise Killian, MD [facility] Pager [phone]  [phone]  If 7PM-7AM, please contact night-coverage 01/22/2021, 8:39 AM

## 2021-01-22 NOTE — ED Notes (Signed)
Phlebotomy @ the bedside. 

## 2021-01-22 NOTE — Progress Notes (Signed)
Handoff given to Leamon Arnt, RN. Transport requested for patient.

## 2021-01-22 NOTE — Evaluation (Signed)
Physical Therapy Evaluation Patient Details Name: Jeremiah Taylor. MRN: [id number] DOB: 10-08-1960 Today's Date: 01/22/2021   History of Present Illness  Pt is a 60 y.o. male with medical history significant for HTN, COPD, Parkinson's disease who presents to the ED with wheezing and shortness of breath that started on 8/8 not responding to home bronchodilator treatments.   Clinical Impression  Patient alert, oriented, denied pain initially but did state he has leg pain with ambulation. At baseline he is independent for mobility, has had 3 falls in the last 3 months, wife lives with him but works two jobs.  The patient demonstrated bed mobility modI, and sit <> stand with CGA/supervision from EOB and standard commode. He ambulated ~175ft with no AD, CGA. Pt did have two LOB, able to correct and often walked into obstacles. Stated his balance has worsened recently, has a neurology follow up visit soon.  Overall the patient demonstrated deficits (see "PT Problem List") that impede the patient's functional abilities, safety, and mobility and would benefit from skilled PT intervention. PT with near return to baseline level of functioning but HHPT recommended to maximize pt safety, balance, and mobility.      Follow Up Recommendations Home health PT;Supervision - Intermittent    Equipment Recommendations  Cane    Recommendations for Other Services       Precautions / Restrictions Precautions Precautions: Fall Restrictions Weight Bearing Restrictions: No      Mobility  Bed Mobility Overal bed mobility: Needs Assistance Bed Mobility: Supine to Sit;Sit to Supine     Supine to sit: Supervision;HOB elevated Sit to supine: Supervision;HOB elevated        Transfers Overall transfer level: Needs assistance Equipment used: None Transfers: Sit to/from Stand Sit to Stand: Supervision;Min guard         General transfer comment: from EOB and standard  commode  Ambulation/Gait Ambulation/Gait assistance: Supervision;Min guard Gait Distance (Feet): 100 Feet Assistive device: None       General Gait Details: did have two LOB, occasional scissoring of gait, walks into obstacles  Stairs            Wheelchair Mobility    Modified Rankin (Stroke Patients Only)       Balance Overall balance assessment: Needs assistance Sitting-balance support: Feet supported Sitting balance-Leahy Scale: Good       Standing balance-Leahy Scale: Good                               Pertinent Vitals/Pain Pain Assessment: Faces Faces Pain Scale: No hurt Pain Location: did report increasing leg pain with ambulation Pain Descriptors / Indicators: Aching;Sore Pain Intervention(s): Limited activity within patient's tolerance;Monitored during session;Repositioned    Home Living Family/patient expects to be discharged to:: Private residence Living Arrangements: Spouse/significant other Available Help at Discharge: Family Type of Home: House Home Access: Stairs to enter Entrance Stairs-Rails: Left Entrance Stairs-Number of Steps: 13 Home Layout: One level Home Equipment: None      Prior Function Level of Independence: Needs assistance   Gait / Transfers Assistance Needed: independent has had 3 falls  ADL's / Homemaking Assistance Needed: IADLs mostly performed by wife, pt stated he minimally performs them        Hand Dominance        Extremity/Trunk Assessment   Upper Extremity Assessment Upper Extremity Assessment: Overall WFL for tasks assessed;Defer to OT evaluation    Lower Extremity Assessment Lower Extremity Assessment:  Generalized weakness;RLE deficits/detail;LLE deficits/detail RLE Deficits / Details: grossly 4-/5 LLE Deficits / Details: grossly 4/5    Cervical / Trunk Assessment Cervical / Trunk Assessment: Other exceptions Cervical / Trunk Exceptions: Patient with rhythmic constant movement in  trunk/neck and UEs throughout session  Communication   Communication: No difficulties  Cognition Arousal/Alertness: Awake/alert Behavior During Therapy: WFL for tasks assessed/performed Overall Cognitive Status: Within Functional Limits for tasks assessed                                        General Comments      Exercises     Assessment/Plan    PT Assessment Patient needs continued PT services  PT Problem List Decreased strength;Decreased mobility;Decreased range of motion;Decreased activity tolerance;Decreased balance;Decreased coordination       PT Treatment Interventions Therapeutic exercise;DME instruction;Gait training;Stair training;Balance training;Neuromuscular re-education;Functional mobility training;Therapeutic activities;Patient/family education    PT Goals (Current goals can be found in the Care Plan section)  Acute Rehab PT Goals Patient Stated Goal: to go home when he feels better PT Goal Formulation: With patient Time For Goal Achievement: 02/05/21 Potential to Achieve Goals: Fair    Frequency Min 2X/week   Barriers to discharge        Co-evaluation               AM-PAC PT "6 Clicks" Mobility  Outcome Measure Help needed turning from your back to your side while in a flat bed without using bedrails?: None Help needed moving from lying on your back to sitting on the side of a flat bed without using bedrails?: None Help needed moving to and from a bed to a chair (including a wheelchair)?: None Help needed standing up from a chair using your arms (e.g., wheelchair or bedside chair)?: None Help needed to walk in hospital room?: A Little Help needed climbing 3-5 steps with a railing? : A Lot 6 Click Score: 21    End of Session Equipment Utilized During Treatment: Gait belt Activity Tolerance: Patient tolerated treatment well Patient left: in chair;with call bell/phone within reach (RN notified of pt status/location and lack of  chair alarm, aware and RN to place) Nurse Communication: Mobility status PT Visit Diagnosis: Other abnormalities of gait and mobility (R26.89);Difficulty in walking, not elsewhere classified (R26.2)    Time: 7628-3151 PT Time Calculation (min) (ACUTE ONLY): 14 min   Charges:   PT Evaluation $PT Eval Low Complexity: 1 Low PT Treatments $Gait Training: 8-22 mins        Olga Coaster PT, DPT 2:38 PM,01/22/21

## 2021-01-23 LAB — BASIC METABOLIC PANEL
Anion gap: 11 (ref 5–15)
BUN: 13 mg/dL (ref 8–23)
CO2: 28 mmol/L (ref 22–32)
Calcium: 9.4 mg/dL (ref 8.9–10.3)
Chloride: 98 mmol/L (ref 98–111)
Creatinine, Ser: 0.88 mg/dL (ref 0.61–1.24)
GFR, Estimated: >60 mL/min (ref >60)
Glucose, Bld: 138 mg/dL — ABNORMAL HIGH (ref 70–99)
Potassium: 4 mmol/L (ref 3.5–5.1)
Sodium: 137 mmol/L (ref 135–145)

## 2021-01-23 LAB — CBC
HCT: 45.4 % (ref 39.0–52.0)
Hemoglobin: 15.7 g/dL (ref 13.0–17.0)
MCH: 33.5 pg (ref 26.0–34.0)
MCHC: 34.6 g/dL (ref 30.0–36.0)
MCV: 96.8 fL (ref 80.0–100.0)
Platelets: 285 10*3/uL (ref 150–400)
RBC: 4.69 MIL/uL (ref 4.22–5.81)
RDW: 13.5 % (ref 11.5–15.5)
WBC: 11.3 10*3/uL — ABNORMAL HIGH (ref 4.0–10.5)
nRBC: 0 % (ref 0.0–0.2)

## 2021-01-23 MED ORDER — MOMETASONE FURO-FORMOTEROL FUM 100-5 MCG/ACT IN AERO
2.0000 | INHALATION_SPRAY | Freq: Two times a day (BID) | RESPIRATORY_TRACT | Status: DC
  Administered 2021-01-23 – 2021-01-24 (×3): 2 via RESPIRATORY_TRACT
  Filled 2021-01-23: qty 8.8

## 2021-01-23 MED ORDER — LISINOPRIL 20 MG PO TABS
40.0000 mg | ORAL_TABLET | Freq: Every day | ORAL | Status: DC
  Administered 2021-01-23 – 2021-01-24 (×2): 40 mg via ORAL
  Filled 2021-01-23 (×2): qty 2

## 2021-01-23 NOTE — Progress Notes (Signed)
Occupational Therapy Treatment Patient Details Name: Jeremiah Taylor. MRN: [id number] DOB: 12-Sep-1960 Today's Date: 01/23/2021    History of present illness Pt is a 60 y.o. male with medical history significant for HTN, COPD, Parkinson's disease who presents to the ED with wheezing and shortness of breath that started on 8/8 not responding to home bronchodilator treatments.   OT comments  Pt seen for OT tx this date to f/u re: safety with ADLs/ADL mobility. Pt in better spirits and noted to be having less tremors this date. OT engages pt in seated LB dressing with SETUP only to don shoes. Pt able to CTS with good stability and SUPV only. OT engages pt in g/h tasks and pt demos decreased tremors with reaching to obtain his ADL items this date. Pt able to perform fxl mobility around nursing unit twice with no AD. Pt demos good tolerance. Returned to room with all needs met and in reach. Will continue to follow. Continue to anticipate that pt will require HHOT to ensure safety and fall prevention in the natural environment.    Follow Up Recommendations  Home health OT    Equipment Recommendations  3 in 1 bedside commode;Tub/shower bench;Other (comment)    Recommendations for Other Services      Precautions / Restrictions Precautions Precautions: Fall Restrictions Weight Bearing Restrictions: No       Mobility Bed Mobility Overal bed mobility: Modified Independent Bed Mobility: Supine to Sit;Sit to Supine           General bed mobility comments: increased time, improved sup<>sit this date w/o assist, HOB slightly elevated.    Transfers Overall transfer level: Needs assistance   Transfers: Sit to/from Stand Sit to Stand: Supervision         General transfer comment: improved control/steadiness this date.    Balance Overall balance assessment: Needs assistance Sitting-balance support: Feet supported Sitting balance-Leahy Scale: Good       Standing balance-Leahy  Scale: Good Standing balance comment: G static standing, F w/ fxl mobility, noted to have nearly scissoring gait that he is able to improve when cued to widen stance.                           ADL either performed or assessed with clinical judgement   ADL Overall ADL's : Needs assistance/impaired     Grooming: Wash/dry hands;Set up;Standing Grooming Details (indicate cue type and reason): improved intentional movements/decreased tremors this date for g/h tasks. Reports his neuro medication is back to his normal home meds and seems to be helping.             Lower Body Dressing: Set up;Sitting/lateral leans Lower Body Dressing Details (indicate cue type and reason): to don slip on shoes seated EOB                     Vision Patient Visual Report: No change from baseline     Perception     Praxis      Cognition Arousal/Alertness: Awake/alert Behavior During Therapy: WFL for tasks assessed/performed Overall Cognitive Status: Within Functional Limits for tasks assessed                                          Exercises Other Exercises Other Exercises: ed re: safety considerations including safety scanning for fxl mobility to avoid  hazards.   Shoulder Instructions       General Comments      Pertinent Vitals/ Pain       Pain Assessment: Faces Faces Pain Scale: No hurt  Home Living                                          Prior Functioning/Environment              Frequency  Min 1X/week        Progress Toward Goals  OT Goals(current goals can now be found in the care plan section)  Progress towards OT goals: Progressing toward goals  Acute Rehab OT Goals Patient Stated Goal: to go home when he feels better OT Goal Formulation: With patient Time For Goal Achievement: 02/05/21 Potential to Achieve Goals: Good  Plan Discharge plan remains appropriate    Co-evaluation                  AM-PAC OT "6 Clicks" Daily Activity     Outcome Measure   Help from another person eating meals?: None Help from another person taking care of personal grooming?: None Help from another person toileting, which includes using toliet, bedpan, or urinal?: None Help from another person bathing (including washing, rinsing, drying)?: A Little Help from another person to put on and taking off regular upper body clothing?: None Help from another person to put on and taking off regular lower body clothing?: None 6 Click Score: 23    End of Session Equipment Utilized During Treatment: Gait belt  OT Visit Diagnosis: Unsteadiness on feet (R26.81);Other abnormalities of gait and mobility (R26.89);Other symptoms and signs involving the nervous system (R29.898)   Activity Tolerance Patient tolerated treatment well   Patient Left with call bell/phone within reach;in bed   Nurse Communication Mobility status        Time: 8206-0156 OT Time Calculation (min): 11 min  Charges: OT General Charges $OT Visit: 1 Visit OT Treatments $Therapeutic Activity: 8-22 mins  Gerrianne Scale, [place], OTR/L ascom [phone] 01/23/21, 5:01 PM

## 2021-01-23 NOTE — Progress Notes (Signed)
Patient 02 sat on room air while sitting in bed was 98%. Ambulated around the unit in hallway on room air and 02 sats were 96%. No complaints of shortness of breath while walking.

## 2021-01-23 NOTE — TOC Initial Note (Addendum)
Transition of Care (TOC) - Initial/Assessment Note    Patient Details  Name: Jeremiah Taylor. MRN: [id number] Date of Birth: [birth year]  Transition of Care [facility]) CM/SW Contact:    Gildardo Griffes, LCSW Phone Number: 01/23/2021, 10:02 AM  Clinical Narrative:                  CSW spoke with patient regarding PT/OT recommendations of home health services. Patient is in agreement, reports no preference of agency as he just moved to the area. Confirmed address in face sheet is accurate.   Patient reports he would be agreeable to receive 3in1, tub bench and can in room prior to dc.   No other dc needs identified at this time.   [place] with [facility] accepted for PT and OT home health services.  CSW has reached out to [place] with [facility] to deliver 3in1, cane, and tub bench to room.    Expected Discharge Plan: Home w Home Health Services Barriers to Discharge: Continued Medical Work up   Patient Goals and CMS Choice Patient states their goals for this hospitalization and ongoing recovery are:: to go home CMS Medicare.gov Compare Post Acute Care list provided to:: Patient Choice offered to / list presented to : Patient  Expected Discharge Plan and Services Expected Discharge Plan: Home w Home Health Services     Post Acute Care Choice: Home Health Living arrangements for the past 2 months: Single Family Home                 DME Arranged: 3-N-1, Tub bench, Cane DME Agency: [facility] Date DME Agency Contacted: 01/23/21 Time DME Agency Contacted: 1000 Representative spoke with at DME Agency: Bjorn Loser HH Arranged: PT, OT          Prior Living Arrangements/Services Living arrangements for the past 2 months: Single Family Home Lives with:: Spouse   Do you feel safe going back to the place where you live?: Yes               Activities of Daily Living Home Assistive Devices/Equipment: Cane (specify quad or straight) ADL Screening (condition at time of  admission) Patient's cognitive ability adequate to safely complete daily activities?: Yes Is the patient deaf or have difficulty hearing?: No Does the patient have difficulty seeing, even when wearing glasses/contacts?: No Does the patient have difficulty concentrating, remembering, or making decisions?: No Patient able to express need for assistance with ADLs?: Yes Does the patient have difficulty dressing or bathing?: No Independently performs ADLs?: Yes (appropriate for developmental age) Does the patient have difficulty walking or climbing stairs?: No Weakness of Legs: None Weakness of Arms/Hands: None  Permission Sought/Granted                  Emotional Assessment   Attitude/Demeanor/Rapport: Gracious Affect (typically observed): Calm Orientation: : Oriented to Self, Oriented to Place, Oriented to  Time, Oriented to Situation Alcohol / Substance Use: Not Applicable Psych Involvement: No (comment)  Admission diagnosis:  COPD exacerbation (HCC) [J44.1] COPD with acute exacerbation (HCC) [J44.1] Patient Active Problem List   Diagnosis Date Noted   COPD with acute exacerbation (HCC) 01/21/2021   Acute respiratory failure with hypoxia (HCC) 01/21/2021   COPD exacerbation (HCC) 01/21/2021   Parkinson's disease (HCC) 02/17/2020   Type 2 diabetes mellitus with other specified complication (HCC) 07/20/2019   HTN (hypertension) 10/04/2006   PCP:  [facility]:   [facility] [address], [place] - [address] [phone]  [address] [place] [address] Phone: [phone] Fax: [phone]     Social Determinants of Health (SDOH) Interventions    Readmission Risk Interventions No flowsheet data found.

## 2021-01-23 NOTE — Progress Notes (Signed)
PROGRESS NOTE    Jeremiah Taylor.  WCH:[id number] DOB: 01-Jan-1961 DOA: 01/20/2021 PCP: [facility]   Assessment & Plan:   Principal Problem:   COPD with acute exacerbation (HCC) Active Problems:   HTN (hypertension)   Parkinson's disease (HCC)   Type 2 diabetes mellitus with other specified complication (HCC)   Acute respiratory failure with hypoxia (HCC)   COPD exacerbation (HCC)    COPD exacerbation: wheezing present still but improved from day prior. Continue on azithromycin, steroids, bronchodilators and supplemental oxygen oxygen. Encourage incentive spirometry   Acute hypoxic respiratory failure: secondary to above. Continue on supplemental oxygen and wean as tolerated. Nurse or tech will complete oxygen desat test today and document the results   HTN: continue on amlodipine, coreg and restarted home dose of lisinopril    Parkinson's disease: continue on home dose of sinemet. Klonopin prn. PT/OT recs HH. HH orders placed      DVT prophylaxis: lovenox  Code Status:  full  Family Communication: discussed pt's care w/ pt's wife and answered her questions  Disposition Plan: likely d/c back home   Level of care: Progressive Cardiac  Status is: Inpatient  Remains inpatient appropriate because:Unsafe d/c plan, IV treatments appropriate due to intensity of illness or inability to take PO, and Inpatient level of care appropriate due to severity of illness  Dispo: The patient is from: Home              Anticipated d/c is to: Home              Patient currently is not medically stable to d/c.   Difficult to place patient unclear     Consultants:   Procedures:   Antimicrobials: azithromycin     Subjective: Pt c/o shortness of breath & wheezes, but improved from day prior  Objective: Vitals:   01/22/21 2030 01/23/21 0337 01/23/21 0737 01/23/21 0746  BP:  (!) 151/100 (!) 169/102   Pulse: 68 62 64   Resp:  18 19   Temp:  97.7 F (36.5 C) 97.7 F  (36.5 C)   TempSrc:   Oral   SpO2:  98% 99% 97%  Weight:      Height:        Intake/Output Summary (Last 24 hours) at 01/23/2021 0758 Last data filed at 01/22/2021 1500 Gross per 24 hour  Intake 480 ml  Output 300 ml  Net 180 ml   Filed Weights   01/20/21 2311  Weight: 87.1 kg    Examination:  General exam: Appears calm & comfortable  Respiratory system: course breath sounds b/l. Wheezes b/l Cardiovascular system: S1 & S2+. No rubs or clicks  Gastrointestinal system: Abd is soft, NT, ND & normal bowel sounds   Central nervous system: Alert and oriented. Moves all extremities  Psychiatry: Judgement and insight appears normal. Flat mood and affect    Data Reviewed: I have personally reviewed following labs and imaging studies  CBC: Recent Labs  Lab 01/20/21 2311 01/22/21 0612 01/23/21 0637  WBC 10.2 11.0* 11.3*  HGB 15.8 14.7 15.7  HCT 44.4 41.5 45.4  MCV 95.5 97.0 96.8  PLT 296 267 285   Basic Metabolic Panel: Recent Labs  Lab 01/20/21 2311 01/22/21 0612 01/23/21 0637  NA 138 136 137  K 3.9 4.2 4.0  CL 106 102 98  CO2 22 26 28   GLUCOSE 109* 196* 138*  BUN 17 16 13   CREATININE 1.09 0.79 0.88  CALCIUM 9.3 9.1 9.4   GFR:  Estimated Creatinine Clearance: 93.9 mL/min (by C-G formula based on SCr of 0.88 mg/dL). Liver Function Tests: Recent Labs  Lab 01/20/21 2311  AST 22  ALT <5  ALKPHOS 52  BILITOT 1.3*  PROT 7.6  ALBUMIN 4.2   No results for input(s): LIPASE, AMYLASE in the last 168 hours. No results for input(s): AMMONIA in the last 168 hours. Coagulation Profile: No results for input(s): INR, PROTIME in the last 168 hours. Cardiac Enzymes: No results for input(s): CKTOTAL, CKMB, CKMBINDEX, TROPONINI in the last 168 hours. BNP (last 3 results) No results for input(s): PROBNP in the last 8760 hours. HbA1C: No results for input(s): HGBA1C in the last 72 hours. CBG: Recent Labs  Lab 01/22/21 2007  GLUCAP 138*   Lipid Profile: No results  for input(s): CHOL, HDL, LDLCALC, TRIG, CHOLHDL, LDLDIRECT in the last 72 hours. Thyroid Function Tests: No results for input(s): TSH, T4TOTAL, FREET4, T3FREE, THYROIDAB in the last 72 hours. Anemia Panel: No results for input(s): VITAMINB12, FOLATE, FERRITIN, TIBC, IRON, RETICCTPCT in the last 72 hours. Sepsis Labs: No results for input(s): PROCALCITON, LATICACIDVEN in the last 168 hours.  Recent Results (from the past 240 hour(s))  Resp Panel by RT-PCR (Flu A&B, Covid) Nasopharyngeal Swab     Status: None   Collection Time: 01/21/21 12:36 AM   Specimen: Nasopharyngeal Swab; Nasopharyngeal(NP) swabs in vial transport medium  Result Value Ref Range Status   SARS Coronavirus 2 by RT PCR NEGATIVE NEGATIVE Final    Comment: (NOTE) SARS-CoV-2 target nucleic acids are NOT DETECTED.  The SARS-CoV-2 RNA is generally detectable in upper respiratory specimens during the acute phase of infection. The lowest concentration of SARS-CoV-2 viral copies this assay can detect is 138 copies/mL. A negative result does not preclude SARS-Cov-2 infection and should not be used as the sole basis for treatment or other patient management decisions. A negative result may occur with  improper specimen collection/handling, submission of specimen other than nasopharyngeal swab, presence of viral mutation(s) within the areas targeted by this assay, and inadequate number of viral copies(<138 copies/mL). A negative result must be combined with clinical observations, patient history, and epidemiological information. The expected result is Negative.  Fact Sheet for Patients:  BloggerCourse.com  Fact Sheet for Healthcare Providers:  SeriousBroker.it  This test is no t yet approved or cleared by the [place] FDA and  has been authorized for detection and/or diagnosis of SARS-CoV-2 by FDA under an Emergency Use Authorization (EUA). This EUA will remain  in  effect (meaning this test can be used) for the duration of the COVID-19 declaration under Section 564(b)(1) of the Act, 21 U.S.C.section 360bbb-3(b)(1), unless the authorization is terminated  or revoked sooner.       Influenza A by PCR NEGATIVE NEGATIVE Final   Influenza B by PCR NEGATIVE NEGATIVE Final    Comment: (NOTE) The Xpert Xpress SARS-CoV-2/FLU/RSV plus assay is intended as an aid in the diagnosis of influenza from Nasopharyngeal swab specimens and should not be used as a sole basis for treatment. Nasal washings and aspirates are unacceptable for Xpert Xpress SARS-CoV-2/FLU/RSV testing.  Fact Sheet for Patients: BloggerCourse.com  Fact Sheet for Healthcare Providers: SeriousBroker.it  This test is not yet approved or cleared by the [place] FDA and has been authorized for detection and/or diagnosis of SARS-CoV-2 by FDA under an Emergency Use Authorization (EUA). This EUA will remain in effect (meaning this test can be used) for the duration of the COVID-19 declaration under Section 564(b)(1) of the  Act, 21 U.S.C. section 360bbb-3(b)(1), unless the authorization is terminated or revoked.  Performed at [facility], [address] [place], [place] [address]          Radiology Studies: No results found.      Scheduled Meds:  amLODipine  10 mg Oral Daily   carbidopa-levodopa  3 tablet Oral 4 times per day   Carbidopa-Levodopa ER  2 tablet Oral QHS   carvedilol  25 mg Oral BID WC   clonazePAM  0.25-0.5 mg Oral QHS   enoxaparin (LOVENOX) injection  40 mg Subcutaneous Q24H   ipratropium-albuterol  3 mL Nebulization Q6H   lisinopril  40 mg Oral Daily   methylPREDNISolone (SOLU-MEDROL) injection  40 mg Intravenous Daily   mometasone-formoterol  2 puff Inhalation BID   polyethylene glycol  17 g Oral Daily   Continuous Infusions:  azithromycin 500 mg (01/22/21 1452)     LOS: 2 days     Time spent: 30 mins     Charise Killian, MD [facility] Pager [phone]  If 7PM-7AM, please contact night-coverage 01/23/2021, 7:58 AM

## 2021-01-24 DIAGNOSIS — I10 Essential (primary) hypertension: Secondary | ICD-10-CM

## 2021-01-24 LAB — CBC
HCT: 47.5 % (ref 39.0–52.0)
Hemoglobin: 16.4 g/dL (ref 13.0–17.0)
MCH: 32.6 pg (ref 26.0–34.0)
MCHC: 34.5 g/dL (ref 30.0–36.0)
MCV: 94.4 fL (ref 80.0–100.0)
Platelets: 331 K/uL (ref 150–400)
RBC: 5.03 MIL/uL (ref 4.22–5.81)
RDW: 13.2 % (ref 11.5–15.5)
WBC: 10.8 K/uL — ABNORMAL HIGH (ref 4.0–10.5)
nRBC: 0 % (ref 0.0–0.2)

## 2021-01-24 LAB — BASIC METABOLIC PANEL
Anion gap: 6 (ref 5–15)
BUN: 13 mg/dL (ref 8–23)
CO2: 30 mmol/L (ref 22–32)
Calcium: 10 mg/dL (ref 8.9–10.3)
Chloride: 100 mmol/L (ref 98–111)
Creatinine, Ser: 0.87 mg/dL (ref 0.61–1.24)
GFR, Estimated: >60 mL/min (ref >60)
Glucose, Bld: 118 mg/dL — ABNORMAL HIGH (ref 70–99)
Potassium: 4 mmol/L (ref 3.5–5.1)
Sodium: 136 mmol/L (ref 135–145)

## 2021-01-24 MED ORDER — PREDNISONE 20 MG PO TABS: 40.0000 mg | ORAL_TABLET | Freq: Every day | ORAL | 0 refills | Status: AC

## 2021-01-24 MED ORDER — IPRATROPIUM-ALBUTEROL 0.5-2.5 (3) MG/3ML IN SOLN: 3.0000 mL | RESPIRATORY_TRACT | 0 refills | Status: DC | PRN

## 2021-01-24 NOTE — Discharge Summary (Signed)
Physician Discharge Summary  Jeremiah SciaraJohnnie Godby Jr. UEA:[id number] DOB: 02/13/1961 DOA: 01/20/2021  PCP: [facility]  Admit date: 01/20/2021 Discharge date: 01/24/2021  Admitted From: home  Disposition:  home  Recommendations for Outpatient Follow-up:  Follow up with PCP in 1-2 weeks F/u w/ pulmon in 1 week   Home Health: yes Equipment/Devices:  Discharge Condition: stable  CODE STATUS: full  Diet recommendation: Heart Healthy   Brief/Interim Summary: HPI was taken from Dr. Para Marchuncan: Jeremiah SciaraJohnnie Moncrieffe Jr. is a 60 y.o. male with medical history significant for HTN, COPD, Parkinson's disease who presents to the ED with wheezing and shortness of breath that started on 8/8 not responding to home bronchodilator treatments.  He had no fever or chills or complaints of chest pain.  On arrival of EMS O2 sat was 84% and he was placed on CPAP.   ED course: On arrival afebrile, BP 138/98, pulse 92, respirations 28 with O2 sat 98% on CPAP.  He was transitioned to BiPAP on arrival Blood work: Troponin 5-4, BNP 8.1, CBC and CMP unremarkable   EKG: Sinus rhythm at 91 with no acute ST-T wave changes   Chest x-ray no active disease   Patient treated with IV Solu-Medrol duo nebs and IV magnesium with only slight improvement though he was able to transition off of BiPAP and is now needing O2 at 6 L.  Hospitalist consulted for admission.  Hospital course from Dr. Mayford KnifeWilliams 8/9-8/12/22: Pt presented w/ shortness of breath and was found to have COPD exacerbation. Pt was treated w/ azithromycin, steroids, bronchodilators, incentive spirometry & supplemental oxygen. Pt was able to be weaned off of supplemental oxygen prior to d/c. PT/OT evaluated the pt and recommended HH. HH was set by CM prior to d/c. For more information, please see previous progress/consult notes.  Discharge Diagnoses:  Principal Problem:   COPD with acute exacerbation (HCC) Active Problems:   HTN (hypertension)   Parkinson's disease  (HCC)   Type 2 diabetes mellitus with other specified complication (HCC)   Acute respiratory failure with hypoxia (HCC)   COPD exacerbation (HCC)  COPD exacerbation: much improved. Continue on azithromycin, steroids, bronchodilators. Weaned off of supplemental oxygen. Encourage incentive spirometry   Acute hypoxic respiratory failure: secondary to above. Weaned off of supplemental oxygen  HTN: continue on amlodipine, coreg and lisinopril    Parkinson's disease: continue on home dose of sinemet. Klonopin prn. PT/OT recs HH.   Discharge Instructions  Discharge Instructions     Diet - low sodium heart healthy   Complete by: As directed    Discharge instructions   Complete by: As directed    F/u w/ PCP in 1-2 weeks. F/u w/ pulmon in 1 week   Increase activity slowly   Complete by: As directed       Allergies as of 01/24/2021   No Known Allergies      Medication List     STOP taking these medications    amLODipine 10 MG tablet Commonly known as: NORVASC   doxycycline 100 MG capsule Commonly known as: VIBRAMYCIN   lisinopril 40 MG tablet Commonly known as: ZESTRIL       TAKE these medications    amLODipine-benazepril 10-40 MG capsule Commonly known as: LOTREL Take 1 capsule by mouth daily.   atorvastatin 40 MG tablet Commonly known as: LIPITOR Take 40 mg by mouth at bedtime. What changed: Another medication with the same name was removed. Continue taking this medication, and follow the directions you see here.  carbidopa-levodopa 25-100 MG tablet Commonly known as: SINEMET IR Take 3 tablets by mouth 4 (four) times daily.   Carbidopa-Levodopa ER 25-100 MG tablet controlled release Commonly known as: SINEMET CR Take 1 tablet by mouth 2 (two) times daily.   carvedilol 25 MG tablet Commonly known as: COREG Take 25 mg by mouth 2 (two) times daily with a meal.   cetirizine 10 MG tablet Commonly known as: ZYRTEC Take 10 mg by mouth daily.   clonazePAM  0.5 MG tablet Commonly known as: KLONOPIN Take 0.25-0.5 mg by mouth at bedtime.   fluticasone 50 MCG/ACT nasal spray Commonly known as: FLONASE Place 1 spray into the nose 2 (two) times daily as needed.   ipratropium 17 MCG/ACT inhaler Commonly known as: ATROVENT HFA Inhale 2 puffs into the lungs 4 (four) times daily.   ipratropium-albuterol 0.5-2.5 (3) MG/3ML Soln Commonly known as: DUONEB Take 3 mLs by nebulization every 4 (four) hours as needed (shortness of breath and/or wheezing). What changed:  when to take this reasons to take this   predniSONE 20 MG tablet Commonly known as: DELTASONE Take 2 tablets (40 mg total) by mouth daily for 5 days. What changed:  medication strength how much to take how to take this when to take this additional instructions   Symbicort 160-4.5 MCG/ACT inhaler Generic drug: budesonide-formoterol Inhale 2 puffs into the lungs 2 (two) times daily.               Durable Medical Equipment  (From admission, onward)           Start     Ordered   01/23/21 1009  For home use only DME 3 n 1  Once        01/23/21 1008   01/23/21 1009  For home use only DME Tub bench  Once        01/23/21 1008   01/23/21 1009  For home use only DME Cane  Once        01/23/21 1008            No Known Allergies  Consultations:    Procedures/Studies: DG Chest Portable 1 View  Result Date: 01/20/2021 CLINICAL DATA:  Shortness of breath EXAM: PORTABLE CHEST 1 VIEW COMPARISON:  11/06/2020 FINDINGS: The heart size and mediastinal contours are within normal limits. Both lungs are clear. The visualized skeletal structures are unremarkable. IMPRESSION: No active disease. Electronically Signed   By: Jasmine Pang M.D.   On: 01/20/2021 23:45   (Echo, Carotid, EGD, Colonoscopy, ERCP)    Subjective: Pt c/o fatigue   Discharge Exam: Vitals:   01/24/21 0735 01/24/21 0824  BP:  (!) 150/96  Pulse:  (!) 56  Resp:  16  Temp:  98.2 F (36.8 C)   SpO2: 96% 98%   Vitals:   01/24/21 0137 01/24/21 0341 01/24/21 0735 01/24/21 0824  BP:  (!) 141/90  (!) 150/96  Pulse:  62  (!) 56  Resp:  18  16  Temp:  97.6 F (36.4 C)  98.2 F (36.8 C)  TempSrc:  Oral  Oral  SpO2: 97% 100% 96% 98%  Weight:      Height:        General: Pt is alert, awake, not in acute distress Cardiovascular:  S1/S2 +, no rubs, no gallops Respiratory: diminished breath sounds b/l  Abdominal: Soft, NT, ND, bowel sounds + Extremities: no cyanosis    The results of significant diagnostics from this hospitalization (including imaging, microbiology, ancillary and  laboratory) are listed below for reference.     Microbiology: Recent Results (from the past 240 hour(s))  Resp Panel by RT-PCR (Flu A&B, Covid) Nasopharyngeal Swab     Status: None   Collection Time: 01/21/21 12:36 AM   Specimen: Nasopharyngeal Swab; Nasopharyngeal(NP) swabs in vial transport medium  Result Value Ref Range Status   SARS Coronavirus 2 by RT PCR NEGATIVE NEGATIVE Final    Comment: (NOTE) SARS-CoV-2 target nucleic acids are NOT DETECTED.  The SARS-CoV-2 RNA is generally detectable in upper respiratory specimens during the acute phase of infection. The lowest concentration of SARS-CoV-2 viral copies this assay can detect is 138 copies/mL. A negative result does not preclude SARS-Cov-2 infection and should not be used as the sole basis for treatment or other patient management decisions. A negative result may occur with  improper specimen collection/handling, submission of specimen other than nasopharyngeal swab, presence of viral mutation(s) within the areas targeted by this assay, and inadequate number of viral copies(<138 copies/mL). A negative result must be combined with clinical observations, patient history, and epidemiological information. The expected result is Negative.  Fact Sheet for Patients:  BloggerCourse.com  Fact Sheet for Healthcare  Providers:  SeriousBroker.it  This test is no t yet approved or cleared by the [place] FDA and  has been authorized for detection and/or diagnosis of SARS-CoV-2 by FDA under an Emergency Use Authorization (EUA). This EUA will remain  in effect (meaning this test can be used) for the duration of the COVID-19 declaration under Section 564(b)(1) of the Act, 21 U.S.C.section 360bbb-3(b)(1), unless the authorization is terminated  or revoked sooner.       Influenza A by PCR NEGATIVE NEGATIVE Final   Influenza B by PCR NEGATIVE NEGATIVE Final    Comment: (NOTE) The Xpert Xpress SARS-CoV-2/FLU/RSV plus assay is intended as an aid in the diagnosis of influenza from Nasopharyngeal swab specimens and should not be used as a sole basis for treatment. Nasal washings and aspirates are unacceptable for Xpert Xpress SARS-CoV-2/FLU/RSV testing.  Fact Sheet for Patients: BloggerCourse.com  Fact Sheet for Healthcare Providers: SeriousBroker.it  This test is not yet approved or cleared by the [place] FDA and has been authorized for detection and/or diagnosis of SARS-CoV-2 by FDA under an Emergency Use Authorization (EUA). This EUA will remain in effect (meaning this test can be used) for the duration of the COVID-19 declaration under Section 564(b)(1) of the Act, 21 U.S.C. section 360bbb-3(b)(1), unless the authorization is terminated or revoked.  Performed at [facility], [address] [place], [place] [address]      Labs: BNP (last 3 results) Recent Labs    01/20/21 2311  BNP 8.1   Basic Metabolic Panel: Recent Labs  Lab 01/20/21 2311 01/22/21 0612 01/23/21 0637 01/24/21 0740  NA 138 136 137 136  K 3.9 4.2 4.0 4.0  CL 106 102 98 100  CO2 GLUCOSE 109* 196* 138* 118*  BUN CREATININE 1.09 0.79 0.88 0.87  CALCIUM 9.3 9.1 9.4 10.0   Liver Function  Tests: Recent Labs  Lab 01/20/21 2311  AST 22  ALT <5  ALKPHOS 52  BILITOT 1.3*  PROT 7.6  ALBUMIN 4.2   No results for input(s): LIPASE, AMYLASE in the last 168 hours. No results for input(s): AMMONIA in the last 168 hours. CBC: Recent Labs  Lab 01/20/21 2311 01/22/21 0612 01/23/21 0637 01/24/21 0740  WBC 10.2 11.0* 11.3* 10.8*  HGB  15.8 14.7 15.7 16.4  HCT 44.4 41.5 45.4 47.5  MCV 95.5 97.0 96.8 94.4  PLT 296 267 285 331   Cardiac Enzymes: No results for input(s): CKTOTAL, CKMB, CKMBINDEX, TROPONINI in the last 168 hours. BNP: Invalid input(s): POCBNP CBG: Recent Labs  Lab 01/22/21 2007  GLUCAP 138*   D-Dimer No results for input(s): DDIMER in the last 72 hours. Hgb A1c No results for input(s): HGBA1C in the last 72 hours. Lipid Profile No results for input(s): CHOL, HDL, LDLCALC, TRIG, CHOLHDL, LDLDIRECT in the last 72 hours. Thyroid function studies No results for input(s): TSH, T4TOTAL, T3FREE, THYROIDAB in the last 72 hours.  Invalid input(s): FREET3 Anemia work up No results for input(s): VITAMINB12, FOLATE, FERRITIN, TIBC, IRON, RETICCTPCT in the last 72 hours. Urinalysis No results found for: COLORURINE, APPEARANCEUR, LABSPEC, PHURINE, GLUCOSEU, HGBUR, BILIRUBINUR, KETONESUR, PROTEINUR, UROBILINOGEN, NITRITE, LEUKOCYTESUR Sepsis Labs Invalid input(s): PROCALCITONIN,  WBC,  LACTICIDVEN Microbiology Recent Results (from the past 240 hour(s))  Resp Panel by RT-PCR (Flu A&B, Covid) Nasopharyngeal Swab     Status: None   Collection Time: 01/21/21 12:36 AM   Specimen: Nasopharyngeal Swab; Nasopharyngeal(NP) swabs in vial transport medium  Result Value Ref Range Status   SARS Coronavirus 2 by RT PCR NEGATIVE NEGATIVE Final    Comment: (NOTE) SARS-CoV-2 target nucleic acids are NOT DETECTED.  The SARS-CoV-2 RNA is generally detectable in upper respiratory specimens during the acute phase of infection. The lowest concentration of SARS-CoV-2 viral copies  this assay can detect is 138 copies/mL. A negative result does not preclude SARS-Cov-2 infection and should not be used as the sole basis for treatment or other patient management decisions. A negative result may occur with  improper specimen collection/handling, submission of specimen other than nasopharyngeal swab, presence of viral mutation(s) within the areas targeted by this assay, and inadequate number of viral copies(<138 copies/mL). A negative result must be combined with clinical observations, patient history, and epidemiological information. The expected result is Negative.  Fact Sheet for Patients:  BloggerCourse.com  Fact Sheet for Healthcare Providers:  SeriousBroker.it  This test is no t yet approved or cleared by the [place] FDA and  has been authorized for detection and/or diagnosis of SARS-CoV-2 by FDA under an Emergency Use Authorization (EUA). This EUA will remain  in effect (meaning this test can be used) for the duration of the COVID-19 declaration under Section 564(b)(1) of the Act, 21 U.S.C.section 360bbb-3(b)(1), unless the authorization is terminated  or revoked sooner.       Influenza A by PCR NEGATIVE NEGATIVE Final   Influenza B by PCR NEGATIVE NEGATIVE Final    Comment: (NOTE) The Xpert Xpress SARS-CoV-2/FLU/RSV plus assay is intended as an aid in the diagnosis of influenza from Nasopharyngeal swab specimens and should not be used as a sole basis for treatment. Nasal washings and aspirates are unacceptable for Xpert Xpress SARS-CoV-2/FLU/RSV testing.  Fact Sheet for Patients: BloggerCourse.com  Fact Sheet for Healthcare Providers: SeriousBroker.it  This test is not yet approved or cleared by the [place] FDA and has been authorized for detection and/or diagnosis of SARS-CoV-2 by FDA under an Emergency Use Authorization (EUA). This EUA  will remain in effect (meaning this test can be used) for the duration of the COVID-19 declaration under Section 564(b)(1) of the Act, 21 U.S.C. section 360bbb-3(b)(1), unless the authorization is terminated or revoked.  Performed at [facility], [address], [place], [place] [address]      Time coordinating discharge: Over  30 minutes  SIGNED:   Charise Killian, MD  [facility] 01/24/2021, 12:41 PM Pager   If 7PM-7AM, please contact night-coverage

## 2021-01-24 NOTE — Progress Notes (Signed)
Discharge instructions reviewed with patient; no questions or concerns at this time. Wife will arrive to take patient home at approximately 1400. IV removed; telemetry dc'd. Patient is adequate for discharge, VSS.

## 2021-01-24 NOTE — TOC Transition Note (Signed)
Transition of Care [facility]) - CM/SW Discharge Note   Patient Details  Name: Jeremiah Taylor. MRN: [id number] Date of Birth: 02-11-1961  [facility]) CM/SW Contact:  Gildardo Griffes, LCSW Phone Number: 01/24/2021, 1:07 PM   Clinical Narrative:     Patient to discharge home with home health, will have [facility] for PT and OT, all DME delivered to room. Wife to pick up. [place] with [facility] informed of dc today.   No other dc needs identified.   Final next level of care: Home w Home Health Services Barriers to Discharge: No Barriers Identified   Patient Goals and CMS Choice Patient states their goals for this hospitalization and ongoing recovery are:: to go home CMS Medicare.gov Compare Post Acute Care list provided to:: Patient Choice offered to / list presented to : Patient  Discharge Placement                  Name of family member notified: spouse Patient and family notified of of transfer: 01/24/21  Discharge Plan and Services     Post Acute Care Choice: Home Health          DME Arranged: 3-N-1, Tub bench, Cane DME Agency: [facility] Date DME Agency Contacted: 01/23/21 Time DME Agency Contacted: 1000 Representative spoke with at DME Agency: Bjorn Loser HH Arranged: PT, OT HH Agency: [facility] Date [facility] Agency Contacted: 01/24/21 Time HH Agency Contacted: [phone] Representative spoke with at [facility] Agency: [place]  Social Determinants of Health (SDOH) Interventions     Readmission Risk Interventions No flowsheet data found.

## 2021-01-24 NOTE — Care Management Important Message (Signed)
Important Message  Patient Details  Name: Jeremiah Taylor. MRN: [id number] Date of Birth: 11-12-1960   Medicare Important Message Given:  Yes     Kiyoto Slomski 01/24/2021, 1:31 PM

## 2021-04-21 ENCOUNTER — Other Ambulatory Visit: Payer: Self-pay | Admitting: Orthopedic Surgery

## 2021-04-25 ENCOUNTER — Encounter
Admission: RE | Admit: 2021-04-25 | Discharge: 2021-04-25 | Disposition: A | Discharge status: Home / Self Care | Payer: Medicare HMO | Source: Ambulatory Visit | Attending: Orthopedic Surgery | Admitting: Orthopedic Surgery

## 2021-04-25 ENCOUNTER — Other Ambulatory Visit: Payer: Self-pay

## 2021-04-25 HISTORY — DX: Anxiety disorder, unspecified: F41.9

## 2021-04-25 NOTE — Patient Instructions (Addendum)
Your procedure is scheduled on: 04/29/21 Report to [place]. To find out your arrival time please call [phone] between 1PM - 3PM on 04/28/21.  Remember: Instructions that are not followed completely may result in serious medical risk, up to and including death, or upon the discretion of your surgeon and anesthesiologist your surgery may need to be rescheduled.     _X__ 1. Do not eat food after midnight the night before your procedure.                 No gum chewing or hard candies. You may drink clear liquids up to 2 hours                 before you are scheduled to arrive for your surgery- DO not drink clear                 liquids within 2 hours of the start of your surgery.                 Clear Liquids include:  water, apple juice without pulp, clear carbohydrate                 drink such as Clearfast or Gatorade, Black Coffee or Tea (Do not add                 anything to coffee or tea). Diabetics water only  __X__2.  On the morning of surgery brush your teeth with toothpaste and water, you                 may rinse your mouth with mouthwash if you wish.  Do not swallow any              toothpaste of mouthwash.     _X__ 3.  No Alcohol for 24 hours before or after surgery.   _X__ 4.  Do Not Smoke or use e-cigarettes For 24 Hours Prior to Your Surgery.                 Do not use any chewable tobacco products for at least 6 hours prior to                 surgery.  ____  5.  Bring all medications with you on the day of surgery if instructed.   __X__  6.  Notify your doctor if there is any change in your medical condition      (cold, fever, infections).     Do not wear jewelry, make-up, hairpins, clips or nail polish. Do not wear lotions, powders, or perfumes.  Do not shave body hair 48 hours prior to surgery. Men may shave face and neck. Do not bring valuables to the hospital.    [facility] is not responsible for any  belongings or valuables.  Contacts, dentures/partials or body piercings may not be worn into surgery. Bring a case for your contacts, glasses or hearing aids, a denture cup will be supplied. Leave your suitcase in the car. After surgery it may be brought to your room. For patients admitted to the hospital, discharge time is determined by your treatment team.   Patients discharged the day of surgery will not be allowed to drive home.   Please read over the following fact sheets that you were given:     __X__ Take these medicines the morning of surgery with A SIP OF WATER:  1. carbidopa-levodopa (SINEMET)  2. carvedilol (COREG) 25 MG tablet  3. escitalopram (LEXAPRO) 10 MG tablet  4. cetirizine (ZYRTEC) 10 MG tablet  5.  6.  ____ Fleet Enema (as directed)   __X__ Use CHG Soap/SAGE wipes as directed  __X__ Use inhalers on the day of surgery  ____ Stop metformin/Janumet/Farxiga 2 days prior to surgery    ____ Take 1/2 of usual insulin dose the night before surgery. No insulin the morning          of surgery.   ____ Stop Blood Thinners Coumadin/Plavix/Xarelto/Pleta/Pradaxa/Eliquis/Effient/Aspirin  on   Or contact your Surgeon, Cardiologist or Medical Doctor regarding  ability to stop your blood thinners  __X__ Stop Anti-inflammatories 7 days before surgery such as Advil, Ibuprofen, Motrin,  BC or Goodies Powder, Naprosyn, Naproxen, Aleve, Aspirin   MAY TAKE TYLENOL IF NEEDED  __X__ Stop all herbals and  supplements, fish oil and vitamins until after surgery.    ____ Bring C-Pap to the hospital.    Shower the night before and the morning of surgery with Dial "gold" bar soap if available

## 2021-04-29 ENCOUNTER — Encounter: Payer: Self-pay | Admitting: Orthopedic Surgery

## 2021-04-29 ENCOUNTER — Ambulatory Visit: Payer: Medicare HMO | Admitting: Certified Registered"

## 2021-04-29 ENCOUNTER — Other Ambulatory Visit: Payer: Self-pay

## 2021-04-29 ENCOUNTER — Encounter
Admission: RE | Disposition: A | Discharge status: Home / Self Care | Payer: Self-pay | Source: Ambulatory Visit | Attending: Orthopedic Surgery

## 2021-04-29 ENCOUNTER — Ambulatory Visit
Admission: RE | Admit: 2021-04-29 | Discharge: 2021-04-29 | Disposition: A | Discharge status: Home / Self Care | Payer: Medicare HMO | Source: Ambulatory Visit | Attending: Orthopedic Surgery | Admitting: Orthopedic Surgery

## 2021-04-29 DIAGNOSIS — E119 Type 2 diabetes mellitus without complications: Secondary | ICD-10-CM | POA: Diagnosis not present

## 2021-04-29 DIAGNOSIS — Z87891 Personal history of nicotine dependence: Secondary | ICD-10-CM | POA: Insufficient documentation

## 2021-04-29 DIAGNOSIS — M7021 Olecranon bursitis, right elbow: Secondary | ICD-10-CM | POA: Insufficient documentation

## 2021-04-29 DIAGNOSIS — Z79899 Other long term (current) drug therapy: Secondary | ICD-10-CM | POA: Insufficient documentation

## 2021-04-29 DIAGNOSIS — I10 Essential (primary) hypertension: Secondary | ICD-10-CM | POA: Diagnosis not present

## 2021-04-29 DIAGNOSIS — J449 Chronic obstructive pulmonary disease, unspecified: Secondary | ICD-10-CM | POA: Insufficient documentation

## 2021-04-29 DIAGNOSIS — Z7951 Long term (current) use of inhaled steroids: Secondary | ICD-10-CM | POA: Insufficient documentation

## 2021-04-29 DIAGNOSIS — G20 Parkinson's disease: Secondary | ICD-10-CM | POA: Diagnosis not present

## 2021-04-29 HISTORY — PX: OLECRANON BURSECTOMY: SHX2097

## 2021-04-29 SURGERY — BURSECTOMY, ELBOW
Anesthesia: General | Site: Elbow | Laterality: Right

## 2021-04-29 MED ORDER — IPRATROPIUM-ALBUTEROL 0.5-2.5 (3) MG/3ML IN SOLN
3.0000 mL | Freq: Once | RESPIRATORY_TRACT | Status: AC
  Administered 2021-04-29: 3 mL via RESPIRATORY_TRACT

## 2021-04-29 MED ORDER — CHLORHEXIDINE GLUCONATE 0.12 % MT SOLN
OROMUCOSAL | Status: AC
  Administered 2021-04-29: 15 mL via OROMUCOSAL
  Filled 2021-04-29: qty 15

## 2021-04-29 MED ORDER — ACETAMINOPHEN 10 MG/ML IV SOLN
INTRAVENOUS | Status: DC | PRN
  Administered 2021-04-29: 1000 mg via INTRAVENOUS

## 2021-04-29 MED ORDER — PROPOFOL 10 MG/ML IV BOLUS
INTRAVENOUS | Status: DC | PRN
  Administered 2021-04-29: 150 mg via INTRAVENOUS

## 2021-04-29 MED ORDER — ONDANSETRON HCL 4 MG/2ML IJ SOLN
INTRAMUSCULAR | Status: DC | PRN
  Administered 2021-04-29: 4 mg via INTRAVENOUS

## 2021-04-29 MED ORDER — BUPIVACAINE HCL (PF) 0.5 % IJ SOLN
INTRAMUSCULAR | Status: DC | PRN
  Administered 2021-04-29: 10 mL

## 2021-04-29 MED ORDER — LACTATED RINGERS IV SOLN: INTRAVENOUS | Status: DC

## 2021-04-29 MED ORDER — FAMOTIDINE 20 MG PO TABS: 20.0000 mg | ORAL_TABLET | Freq: Once | ORAL | Status: AC

## 2021-04-29 MED ORDER — HYDROCODONE-ACETAMINOPHEN 5-325 MG PO TABS: 1.0000 | ORAL_TABLET | Freq: Four times a day (QID) | ORAL | 0 refills | Status: DC | PRN

## 2021-04-29 MED ORDER — LIDOCAINE HCL (CARDIAC) PF 100 MG/5ML IV SOSY
PREFILLED_SYRINGE | INTRAVENOUS | Status: DC | PRN
  Administered 2021-04-29: 100 mg via INTRAVENOUS

## 2021-04-29 MED ORDER — CHLORHEXIDINE GLUCONATE 0.12 % MT SOLN: 15.0000 mL | Freq: Once | OROMUCOSAL | Status: AC

## 2021-04-29 MED ORDER — FENTANYL CITRATE (PF) 100 MCG/2ML IJ SOLN
INTRAMUSCULAR | Status: AC
  Filled 2021-04-29: qty 2

## 2021-04-29 MED ORDER — IPRATROPIUM-ALBUTEROL 0.5-2.5 (3) MG/3ML IN SOLN
RESPIRATORY_TRACT | Status: AC
  Filled 2021-04-29: qty 3

## 2021-04-29 MED ORDER — PROPOFOL 10 MG/ML IV BOLUS
INTRAVENOUS | Status: AC
  Filled 2021-04-29: qty 20

## 2021-04-29 MED ORDER — CEFAZOLIN SODIUM-DEXTROSE 2-4 GM/100ML-% IV SOLN
INTRAVENOUS | Status: AC
  Filled 2021-04-29: qty 100

## 2021-04-29 MED ORDER — FAMOTIDINE 20 MG PO TABS
ORAL_TABLET | ORAL | Status: AC
  Administered 2021-04-29: 20 mg via ORAL
  Filled 2021-04-29: qty 1

## 2021-04-29 MED ORDER — PHENYLEPHRINE HCL (PRESSORS) 10 MG/ML IV SOLN
INTRAVENOUS | Status: AC
  Filled 2021-04-29: qty 1

## 2021-04-29 MED ORDER — DEXAMETHASONE SODIUM PHOSPHATE 10 MG/ML IJ SOLN
INTRAMUSCULAR | Status: AC
  Administered 2021-04-29: 5 mg
  Filled 2021-04-29: qty 1

## 2021-04-29 MED ORDER — IPRATROPIUM-ALBUTEROL 0.5-2.5 (3) MG/3ML IN SOLN
RESPIRATORY_TRACT | Status: AC
  Administered 2021-04-29: 3 mL
  Filled 2021-04-29: qty 3

## 2021-04-29 MED ORDER — ACETAMINOPHEN 10 MG/ML IV SOLN
INTRAVENOUS | Status: AC
  Filled 2021-04-29: qty 100

## 2021-04-29 MED ORDER — FENTANYL CITRATE (PF) 100 MCG/2ML IJ SOLN
25.0000 ug | INTRAMUSCULAR | Status: DC | PRN
  Administered 2021-04-29 (×6): 25 ug via INTRAVENOUS

## 2021-04-29 MED ORDER — DEXAMETHASONE SODIUM PHOSPHATE 10 MG/ML IJ SOLN
INTRAMUSCULAR | Status: DC | PRN
Start: 2021-04-29 — End: 2021-04-29
  Administered 2021-04-29: 5 mg via INTRAVENOUS

## 2021-04-29 MED ORDER — IPRATROPIUM-ALBUTEROL 0.5-2.5 (3) MG/3ML IN SOLN: 3.0000 mL | RESPIRATORY_TRACT | Status: DC

## 2021-04-29 MED ORDER — CEFAZOLIN SODIUM-DEXTROSE 2-4 GM/100ML-% IV SOLN
2.0000 g | INTRAVENOUS | Status: AC
  Administered 2021-04-29: 2 g via INTRAVENOUS

## 2021-04-29 MED ORDER — DEXAMETHASONE SODIUM PHOSPHATE 4 MG/ML IJ SOLN: 5.0000 mg | Freq: Once | INTRAMUSCULAR | Status: AC

## 2021-04-29 MED ORDER — ONDANSETRON HCL 4 MG/2ML IJ SOLN: 4.0000 mg | Freq: Once | INTRAMUSCULAR | Status: DC | PRN

## 2021-04-29 MED ORDER — BUPIVACAINE HCL (PF) 0.5 % IJ SOLN
INTRAMUSCULAR | Status: AC
  Filled 2021-04-29: qty 30

## 2021-04-29 MED ORDER — ORAL CARE MOUTH RINSE: 15.0000 mL | Freq: Once | OROMUCOSAL | Status: AC

## 2021-04-29 MED ORDER — SODIUM CHLORIDE 0.9 % IR SOLN
Status: DC | PRN
  Administered 2021-04-29: 500 mL

## 2021-04-29 MED ORDER — FENTANYL CITRATE (PF) 100 MCG/2ML IJ SOLN
INTRAMUSCULAR | Status: DC | PRN
  Administered 2021-04-29 (×4): 25 ug via INTRAVENOUS

## 2021-04-29 SURGICAL SUPPLY — 36 items
APL PRP STRL LF DISP 70% ISPRP (MISCELLANEOUS) ×1
CHLORAPREP W/TINT 26 (MISCELLANEOUS) ×3 IMPLANT
CLOSURE WOUND 1/2 X4 (GAUZE/BANDAGES/DRESSINGS) ×1
CUFF TOURN SGL QUICK 18X4 (TOURNIQUET CUFF) IMPLANT
CUFF TOURN SGL QUICK 24 (TOURNIQUET CUFF)
CUFF TRNQT CYL 24X4X16.5-23 (TOURNIQUET CUFF) IMPLANT
DRAIN PENROSE 1/4X12 LTX STRL (WOUND CARE) ×3 IMPLANT
ELECT CAUTERY BLADE 6.4 (BLADE) ×3 IMPLANT
ELECT REM PT RETURN 9FT ADLT (ELECTROSURGICAL) ×3
ELECTRODE REM PT RTRN 9FT ADLT (ELECTROSURGICAL) ×1 IMPLANT
GAUZE 4X4 16PLY ~~LOC~~+RFID DBL (SPONGE) ×3 IMPLANT
GAUZE SPONGE 4X4 12PLY STRL (GAUZE/BANDAGES/DRESSINGS) ×3 IMPLANT
GAUZE XEROFORM 1X8 LF (GAUZE/BANDAGES/DRESSINGS) ×3 IMPLANT
GLOVE SURG SYN 9.0  PF PI (GLOVE) ×2
GLOVE SURG SYN 9.0 PF PI (GLOVE) ×1 IMPLANT
GOWN SRG 2XL LVL 4 RGLN SLV (GOWNS) ×1 IMPLANT
GOWN STRL NON-REIN 2XL LVL4 (GOWNS) ×3
GOWN STRL REUS W/ TWL LRG LVL3 (GOWN DISPOSABLE) ×1 IMPLANT
GOWN STRL REUS W/TWL LRG LVL3 (GOWN DISPOSABLE) ×3
KIT TURNOVER KIT A (KITS) ×3 IMPLANT
MANIFOLD NEPTUNE II (INSTRUMENTS) ×3 IMPLANT
NEEDLE FILTER BLUNT 18X 1/2SAF (NEEDLE) ×2
NEEDLE FILTER BLUNT 18X1 1/2 (NEEDLE) ×1 IMPLANT
NS IRRIG 500ML POUR BTL (IV SOLUTION) ×3 IMPLANT
PACK EXTREMITY ARMC (MISCELLANEOUS) ×3 IMPLANT
PAD ABD DERMACEA PRESS 5X9 (GAUZE/BANDAGES/DRESSINGS) ×3 IMPLANT
PAD CAST CTTN 4X4 STRL (SOFTGOODS) ×2 IMPLANT
PADDING CAST COTTON 4X4 STRL (SOFTGOODS) ×6
SCALPEL PROTECTED #15 DISP (BLADE) ×6 IMPLANT
SPLINT CAST 1 STEP 3X12 (MISCELLANEOUS) ×6 IMPLANT
SPONGE T-LAP 18X18 ~~LOC~~+RFID (SPONGE) ×3 IMPLANT
STRIP CLOSURE SKIN 1/2X4 (GAUZE/BANDAGES/DRESSINGS) ×2 IMPLANT
SUT VIC AB 2-0 SH 27 (SUTURE) ×3
SUT VIC AB 2-0 SH 27XBRD (SUTURE) ×1 IMPLANT
SUT VIC AB 3-0 PS2 18 (SUTURE) ×3 IMPLANT
WATER STERILE IRR 500ML POUR (IV SOLUTION) ×3 IMPLANT

## 2021-04-29 NOTE — Anesthesia Procedure Notes (Signed)
Procedure Name: LMA Insertion Date/Time: 04/29/2021 12:28 PM Performed by: Clyde Lundborg, CRNA Pre-anesthesia Checklist: Patient identified, Emergency Drugs available, Suction available and Patient being monitored Patient Re-evaluated:Patient Re-evaluated prior to induction Oxygen Delivery Method: Circle system utilized Preoxygenation: Pre-oxygenation with 100% oxygen Induction Type: IV induction Ventilation: Mask ventilation without difficulty LMA: LMA inserted LMA Size: 4.0 Tube type: Oral Number of attempts: 1 Airway Equipment and Method: Stylet and Oral airway Placement Confirmation: positive ETCO2, breath sounds checked- equal and bilateral and CO2 detector Tube secured with: Tape Dental Injury: Teeth and Oropharynx as per pre-operative assessment

## 2021-04-29 NOTE — Op Note (Signed)
04/29/2021  1:01 PM  PATIENT:  Jeremiah Taylor.  60 y.o. male  PRE-OPERATIVE DIAGNOSIS:  Olecranon bursitis of right elbow  M70.21  POST-OPERATIVE DIAGNOSIS:  Olecranon bursitis of right elbow  M70.21  PROCEDURE:  Procedure(s): Right olecranon bursa excision (Right)  SURGEON: Leitha Schuller, MD  ASSISTANTS: None  ANESTHESIA:   general  EBL:  Total I/O In: 700 [I.V.:500; IV Piggyback:200] Out: 2 [Blood:2]  BLOOD ADMINISTERED:none  DRAINS: Penrose drain in the olecranon bursa    LOCAL MEDICATIONS USED:  MARCAINE     SPECIMEN:  No Specimen  DISPOSITION OF SPECIMEN:  N/A  COUNTS:  YES  TOURNIQUET:   Total Tourniquet Time Documented: Upper Arm (Right) - 17 minutes Total: Upper Arm (Right) - 17 minutes   IMPLANTS: None  DICTATION: .Dragon Dictation patient was brought to the operating room and after adequate general anesthesia was obtained the right arm was prepped and draped you sterile fashion.  After patient identification and timeout procedure were completed tourniquet was raised to her 50 mmHg.  Elliptical excision made with a portion of skin excised on the dorsal radial side of the olecranon bursa with a large amount of bloody fluid within the bursa.  The bursa was then debrided there were several loose bodies that were not calcified the skin edges were debrided as well with sharp dissection using a scalpel to get to fresh tissue.  After adequate resection of the bursal tissue the wound was irrigated and then closed with 2-0 Vicryl to try to tack the skin down to the forearm fascia.  3-0 subcutaneous Vicryl was placed after the Penrose drain was inserted and skin staples used.  The wound was then dressed with Xeroform 4 x 4's ABD web roll and splint followed by Ace wrap's tourniquet let down at the close of the case  PLAN OF CARE: Discharge to home after PACU  PATIENT DISPOSITION:  PACU - hemodynamically stable.

## 2021-04-29 NOTE — Transfer of Care (Signed)
Immediate Anesthesia Transfer of Care Note  Patient: Jeremiah Taylor.  Procedure(s) Performed: Right olecranon bursa excision (Right: Elbow)  Patient Location: PACU  Anesthesia Type:General  Level of Consciousness: awake  Airway & Oxygen Therapy: Patient Spontanous Breathing and Patient connected to face mask oxygen  Post-op Assessment: Report given to RN and Post -op Vital signs reviewed and stable  Post vital signs: Reviewed and stable  Last Vitals:  Vitals Value Taken Time  BP    Temp    Pulse    Resp    SpO2      Last Pain:  Vitals:   04/29/21 1022  TempSrc: Temporal  PainSc: 6          Complications: No notable events documented.

## 2021-04-29 NOTE — Discharge Instructions (Addendum)
Keep dressing and splint clean and dry.   Do not worry if there is some bloody drainage Pain medicine as directed Ice to the elbow may help with pain and bleeding Call office if you are having problems  AMBULATORY SURGERY  DISCHARGE INSTRUCTIONS   The drugs that you were given will stay in your system until tomorrow so for the next 24 hours you should not:  Drive an automobile Make any legal decisions Drink any alcoholic beverage   You may resume regular meals tomorrow.  Today it is better to start with liquids and gradually work up to solid foods.  You may eat anything you prefer, but it is better to start with liquids, then soup and crackers, and gradually work up to solid foods.   Please notify your doctor immediately if you have any unusual bleeding, trouble breathing, redness and pain at the surgery site, drainage, fever, or pain not relieved by medication.    Additional Instructions:     Please contact your physician with any problems or Same Day Surgery at [phone], Monday through Friday 6 am to 4 pm, or [place] at [facility] number at [phone].

## 2021-04-29 NOTE — Anesthesia Postprocedure Evaluation (Signed)
Anesthesia Post Note  Patient: Jeremiah Taylor.  Procedure(s) Performed: Right olecranon bursa excision (Right: Elbow)  Patient location during evaluation: PACU Anesthesia Type: General Level of consciousness: awake and alert Pain management: pain level controlled Vital Signs Assessment: post-procedure vital signs reviewed and stable Respiratory status: spontaneous breathing, nonlabored ventilation and respiratory function stable Cardiovascular status: blood pressure returned to baseline and stable Postop Assessment: no apparent nausea or vomiting Anesthetic complications: no Comments: Pt was evaluated in the pacu for potential respiratory distress. The patient had reported shortness of breath in the pacu. He was wheezing and the o2 saturation was difficult to obtain due to tremors. He received two treatments of duonebs and 5mg  decadron. When I arrived, the patient was sitting up in bed with whole body tremor including head and neck. An ear probe for 02 saturation was placed with a poor waveform. HR and BP were normal. Pt was on 3 L [place]. He reported having SOB. A finger 02 sat probe was placed on his right hand that was not moving from tremors and read 100%. The oxygen was removed and his saturations remained >95%. He had mild wheezing in RUL to ascultation with mild tachypnea. He was reassured that his saturations were normal. A discussion was made about potential treatment options. He did report having anxiety about the situation. After time and discussion with his wife, he stated he felt well without SOB. He did not have wheezing but was slightly tachypneic. He requested to go home. He was instructed to go the ED for respiratory distress. He understood and will resume his scheduled medications.    No notable events documented.   Last Vitals:  Vitals:   04/29/21 1600 04/29/21 1609  BP:  (!) 163/99  Pulse: (!) 149 (!) 106  Resp:    Temp:    SpO2: 99% 96%    Last Pain:  Vitals:    04/29/21 1428  TempSrc:   PainSc: 6                  05/01/21

## 2021-04-29 NOTE — H&P (Signed)
Chief Complaint  Patient presents with   Right Elbow - Follow-up    History of the Present Illness: Jeremiah Taylor is a 60 y.o. male here today.   The patient presents for evaluation of a very large right olecranon bursitis that was partially drained last week. With his Parkinson's, it was difficult and he pulled a needle out before complete aspiration. A bandage was applied, and it continued to drain. He comes back today for recheck. He was placed on antibiotics to prevent infection from the aspiration.  The patient states his right elbow is still swollen, and it is a little sore. The patient would prefer to have his bursa removed. He his general health is great. He is not on any blood thinners.  The patient is left-hand dominant. His wife can help him at home.  I have reviewed past medical, surgical, social and family history, and allergies as documented in the EMR.  Past Medical History: Past Medical History:  Diagnosis Date   Asthma   Hypertension   Parkinson's disease (CMS-HCC)   SVT (supraventricular tachycardia) (CMS-HCC)   Past Surgical History: Past Surgical History:  Procedure Laterality Date   CHOLECYSTECTOMY   COLONOSCOPY W/REMOVAL LESIONS BY SNARE N/A 11/19/2020  Procedure: COLONOSCOPY, FLEXIBLE; WITH REMOVAL OF TUMOR(S), POLYP(S), OR OTHER LESION(S) BY SNARE TECHNIQUE; Surgeon: Mercie Eon, MD; Location: [facility]; Service: Gastroenterology; Laterality: N/A;   ENDOSCOPIC CARPAL TUNNEL RELEASE Right   REPLACEMENT TOTAL KNEE   Past Family History: Family History  Problem Relation Age of Onset   High blood pressure (Hypertension) Mother   Diabetes type II Mother   Anesthesia problems Neg Hx   Medications: Current Outpatient Medications Ordered in Epic  Medication Sig Dispense Refill   albuterol 90 mcg/actuation inhaler Inhale 2 inhalations into the lungs every 4 (four) hours as needed for Wheezing 3 each 3   EPINEPHrine (EPIPEN) 0.3 mg/0.3 mL  auto-injector   ibuprofen (MOTRIN) 600 MG tablet Take 1 tablet (600 mg total) by mouth every 8 (eight) hours as needed for Pain 40 tablet 2   amLODIPine-benazepril (LOTREL) 10-40 mg capsule Take 1 capsule by mouth once daily 90 capsule 3   atorvastatin (LIPITOR) 40 MG tablet Take 1 tablet (40 mg total) by mouth nightly 90 tablet 3   budesonide (PULMICORT) 0.5 mg/2 mL nebulizer solution Take 2 mLs (0.5 mg total) by nebulization 2 (two) times daily 60 mL 11   budesonide-formoteroL (SYMBICORT) 160-4.5 mcg/actuation inhaler Inhale 2 inhalations into the lungs 2 (two) times daily 30.6 g 3   carbidopa-levodopa (SINEMET) 25-100 mg tablet Take 1 tablet by mouth 4 (four) times daily 360 tablet 3   carbidopa-levodopa 61.25-245 mg CpER Take 3 capsules by mouth 3 (three) times daily 810 capsule 3   carvediloL (COREG) 12.5 MG tablet Take 1 tablet (12.5 mg total) by mouth 2 (two) times daily with meals 180 tablet 3   cephalexin (KEFLEX) 500 MG capsule Take 1 capsule (500 mg total) by mouth 4 (four) times daily for 5 days 20 capsule 0   cetirizine (ZYRTEC) 10 MG tablet Take 1 tablet (10 mg total) by mouth once daily 90 tablet 3   cholecalciferol (CHOLECALCIFEROL) 1000 unit tablet Take 1 tablet (1,000 Units total) by mouth once daily 90 tablet 3   clonazePAM (KLONOPIN) 0.5 MG tablet Take 1 tablet (0.5 mg total) by mouth at bedtime 30 tablet 5   dupilumab (DUPIXENT) 300 mg/2 mL pen injector Inject 2 mLs (300 mg total) subcutaneously every 14 (fourteen) days  4 mL 11   escitalopram oxalate (LEXAPRO) 10 MG tablet Take 1 tablet (10 mg total) by mouth once daily for 90 days 30 tablet 2   fluticasone propionate (FLONASE) 50 mcg/actuation nasal spray Place 1 spray into both nostrils 2 (two) times daily as needed 48 g 3   HYDROcodone-acetaminophen (NORCO) 5-325 mg tablet Take 1 tablet by mouth every 6 (six) hours as needed for Pain (Do not combine with anxiety or sleep med.) for up to 20 doses (Patient not taking: No sig  reported) 20 tablet 0   inhalational spacer (AEROCHAMBER) spacer Use as instructed with inhaler. 1 each 1   ipratropium-albuteroL (DUO-NEB) nebulizer solution Take 3 mLs by nebulization 4 (four) times daily for 360 days 1080 mL 3   montelukast (SINGULAIR) 10 mg tablet Take 1 tablet (10 mg total) by mouth at bedtime 30 tablet 11   multivitamin tablet Take 1 tablet by mouth once daily 30 tablet 5   predniSONE (DELTASONE) 20 MG tablet Take 1 tablet (20 mg total) by mouth once daily (Patient not taking: No sig reported) 5 tablet 0   promethazine-dextromethorphan (PROMETHAZINE-DM) 6.25-15 mg/5 mL syrup Take 5 mLs by mouth every 6 (six) hours as needed (Patient not taking: No sig reported) 180 mL 0   tiotropium bromide (SPIRIVA RESPIMAT) 2.5 mcg/actuation inhalation spray Inhale 2 inhalations (5 mcg total) into the lungs once daily 12 g 3   No current Epic-ordered facility-administered medications on file.   Allergies: No Known Allergies   Body mass index is 25.72 kg/m.  Review of Systems: A comprehensive 14 point ROS was performed, reviewed, and the pertinent orthopaedic findings are documented in the HPI.  Vitals:  04/16/21 1028  BP: 122/78    General Physical Examination:   General/Constitutional: No apparent distress: well-nourished and well developed. Eyes: Pupils equal, round with synchronous movement. Lungs: Clear to auscultation HEENT: Normal with normal dentition. Vascular: No edema, swelling or tenderness, except as noted in detailed exam. Cardiac: Heart rate and rhythm is regular. Integumentary: No impressive skin lesions present, except as noted in detailed exam. Neuro/Psych: Normal mood and affect, oriented to person, place and time.  On exam, right elbow edema. Right elbow range of motion of 5-90 degrees. Right olecranon bursitis that is 8 x 10 cm in diameter, fluctuant. No signs of infection.  Radiographs:  No results were obtained or interpreted  today.  Assessment: ICD-10-CM  1. Olecranon bursitis of right elbow M70.21   Plan:  The patient has clinical findings of large right olecranon bursitis with failure of treatment with aspiration.  We discussed the patient's prior x-ray findings. We will plan for right bursal excision. I explained the surgery and postoperative course in detail.  Surgical Risks:  The nature of the condition and the proposed procedure has been reviewed in detail with the patient. Surgical versus non-surgical options and prognosis for recovery have been reviewed and the inherent risks and benefits of each have been discussed including the risks of infection, bleeding, injury to nerves/blood vessels/tendons, incomplete relief of symptoms, persisting pain and/or stiffness, loss of function, complex regional pain syndrome, failure of the procedure, as appropriate.  Teeth: Normal dentition  Scribe Attestation: I, Dawn Royse, am acting as scribe for [employer], MD.   Electronically signed by Marlena Clipper, MD at 04/16/2021 7:53 PM EDT  Reviewed  H+P. No changes noted.

## 2021-04-29 NOTE — Anesthesia Preprocedure Evaluation (Signed)
Anesthesia Evaluation  Patient identified by MRN, date of birth, ID band Patient awake    Reviewed: Allergy & Precautions, H&P , NPO status , Patient's Chart, lab work & pertinent test results, reviewed documented beta blocker date and time   Airway Mallampati: III  TM Distance: >3 FB Neck ROM: full    Dental  (+) Teeth Intact   Pulmonary neg shortness of breath, asthma , COPD,  COPD inhaler, neg recent URI, former smoker,    Pulmonary exam normal        Cardiovascular Exercise Tolerance: Poor hypertension, On Medications negative cardio ROS Normal cardiovascular exam Rhythm:regular Rate:Normal     Neuro/Psych Anxiety negative neurological ROS  negative psych ROS   GI/Hepatic negative GI ROS, Neg liver ROS,   Endo/Other  diabetes  Renal/GU negative Renal ROS  negative genitourinary   Musculoskeletal   Abdominal   Peds  Hematology negative hematology ROS (+)   Anesthesia Other Findings Past Medical History: No date: Anxiety No date: Asthma No date: Hypertension No date: Parkinson disease (HCC) Past Surgical History: No date: CARPAL TUNNEL RELEASE; Right     Comment:  x3 No date: CHOLECYSTECTOMY No date: REPLACEMENT TOTAL KNEE; Right BMI    Body Mass Index: 25.94 kg/m     Reproductive/Obstetrics negative OB ROS                             Anesthesia Physical Anesthesia Plan  ASA: 3  Anesthesia Plan: General ETT   Post-op Pain Management:    Induction:   PONV Risk Score and Plan:   Airway Management Planned:   Additional Equipment:   Intra-op Plan:   Post-operative Plan:   Informed Consent: I have reviewed the patients History and Physical, chart, labs and discussed the procedure including the risks, benefits and alternatives for the proposed anesthesia with the patient or authorized representative who has indicated his/her understanding and acceptance.      Dental Advisory Given  Plan Discussed with: CRNA  Anesthesia Plan Comments:         Anesthesia Quick Evaluation

## 2021-04-30 ENCOUNTER — Encounter: Payer: Self-pay | Admitting: Orthopedic Surgery

## 2021-05-24 ENCOUNTER — Encounter: Payer: Self-pay | Admitting: Emergency Medicine

## 2021-05-24 ENCOUNTER — Emergency Department: Payer: Medicare HMO

## 2021-05-24 ENCOUNTER — Other Ambulatory Visit: Payer: Self-pay

## 2021-05-24 DIAGNOSIS — I160 Hypertensive urgency: Secondary | ICD-10-CM | POA: Insufficient documentation

## 2021-05-24 DIAGNOSIS — Z79899 Other long term (current) drug therapy: Secondary | ICD-10-CM | POA: Diagnosis not present

## 2021-05-24 DIAGNOSIS — Z87891 Personal history of nicotine dependence: Secondary | ICD-10-CM | POA: Insufficient documentation

## 2021-05-24 DIAGNOSIS — R0789 Other chest pain: Principal | ICD-10-CM | POA: Insufficient documentation

## 2021-05-24 DIAGNOSIS — R519 Headache, unspecified: Secondary | ICD-10-CM | POA: Diagnosis not present

## 2021-05-24 DIAGNOSIS — R079 Chest pain, unspecified: Secondary | ICD-10-CM

## 2021-05-24 DIAGNOSIS — I10 Essential (primary) hypertension: Secondary | ICD-10-CM | POA: Insufficient documentation

## 2021-05-24 DIAGNOSIS — E119 Type 2 diabetes mellitus without complications: Secondary | ICD-10-CM | POA: Diagnosis not present

## 2021-05-24 DIAGNOSIS — J45909 Unspecified asthma, uncomplicated: Secondary | ICD-10-CM | POA: Diagnosis not present

## 2021-05-24 DIAGNOSIS — Z20822 Contact with and (suspected) exposure to covid-19: Secondary | ICD-10-CM | POA: Diagnosis not present

## 2021-05-24 DIAGNOSIS — I2510 Atherosclerotic heart disease of native coronary artery without angina pectoris: Secondary | ICD-10-CM | POA: Diagnosis not present

## 2021-05-24 LAB — TROPONIN I (HIGH SENSITIVITY): Troponin I (High Sensitivity): 7 ng/L (ref <18)

## 2021-05-24 LAB — BASIC METABOLIC PANEL
Anion gap: 9 (ref 5–15)
BUN: 19 mg/dL (ref 8–23)
CO2: 25 mmol/L (ref 22–32)
Calcium: 9.4 mg/dL (ref 8.9–10.3)
Chloride: 101 mmol/L (ref 98–111)
Creatinine, Ser: 0.99 mg/dL (ref 0.61–1.24)
GFR, Estimated: >60 mL/min (ref >60)
Glucose, Bld: 80 mg/dL (ref 70–99)
Potassium: 3.9 mmol/L (ref 3.5–5.1)
Sodium: 135 mmol/L (ref 135–145)

## 2021-05-24 LAB — CBC
HCT: 43.6 % (ref 39.0–52.0)
Hemoglobin: 15.1 g/dL (ref 13.0–17.0)
MCH: 33 pg (ref 26.0–34.0)
MCHC: 34.6 g/dL (ref 30.0–36.0)
MCV: 95.4 fL (ref 80.0–100.0)
Platelets: 295 10*3/uL (ref 150–400)
RBC: 4.57 MIL/uL (ref 4.22–5.81)
RDW: 13.3 % (ref 11.5–15.5)
WBC: 10.4 10*3/uL (ref 4.0–10.5)
nRBC: 0 % (ref 0.0–0.2)

## 2021-05-24 NOTE — ED Triage Notes (Signed)
Pt to ED via [facility] with c/o severe CP 10/10 that radiates down his L arm. Per EMS pt with hx of severe parkinson's that's worse when he gets worked up. Pt with noted repeated tremors. Pt describes pain at sharp in nature, per EMS onset approx prior to their arrival. Pt alert and oriented on arrival to triage room.    160 palp 90HR

## 2021-05-24 NOTE — ED Notes (Signed)
Pt repeatedly asking for something to help him "relax", care discussed with Dr. Dolores Frame, no new orders.

## 2021-05-25 ENCOUNTER — Observation Stay
Admission: EM | Admit: 2021-05-25 | Discharge: 2021-05-26 | Disposition: A | Discharge status: Home / Self Care | Payer: Medicare HMO | Attending: Internal Medicine | Admitting: Internal Medicine

## 2021-05-25 ENCOUNTER — Observation Stay: Payer: Medicare HMO

## 2021-05-25 ENCOUNTER — Encounter: Payer: Self-pay | Admitting: Internal Medicine

## 2021-05-25 DIAGNOSIS — E1169 Type 2 diabetes mellitus with other specified complication: Secondary | ICD-10-CM | POA: Diagnosis present

## 2021-05-25 DIAGNOSIS — F419 Anxiety disorder, unspecified: Secondary | ICD-10-CM | POA: Diagnosis present

## 2021-05-25 DIAGNOSIS — I200 Unstable angina: Secondary | ICD-10-CM | POA: Diagnosis not present

## 2021-05-25 DIAGNOSIS — G20 Parkinson's disease: Secondary | ICD-10-CM

## 2021-05-25 DIAGNOSIS — I2510 Atherosclerotic heart disease of native coronary artery without angina pectoris: Secondary | ICD-10-CM | POA: Diagnosis present

## 2021-05-25 DIAGNOSIS — J441 Chronic obstructive pulmonary disease with (acute) exacerbation: Secondary | ICD-10-CM

## 2021-05-25 DIAGNOSIS — G20A1 Parkinson's disease without dyskinesia, without mention of fluctuations: Secondary | ICD-10-CM

## 2021-05-25 DIAGNOSIS — R079 Chest pain, unspecified: Secondary | ICD-10-CM

## 2021-05-25 DIAGNOSIS — I10 Essential (primary) hypertension: Secondary | ICD-10-CM | POA: Diagnosis present

## 2021-05-25 DIAGNOSIS — I160 Hypertensive urgency: Secondary | ICD-10-CM

## 2021-05-25 DIAGNOSIS — R0789 Other chest pain: Secondary | ICD-10-CM | POA: Diagnosis present

## 2021-05-25 LAB — CBC
HCT: 45.5 % (ref 39.0–52.0)
Hemoglobin: 15.5 g/dL (ref 13.0–17.0)
MCH: 32.8 pg (ref 26.0–34.0)
MCHC: 34.1 g/dL (ref 30.0–36.0)
MCV: 96.2 fL (ref 80.0–100.0)
Platelets: 287 10*3/uL (ref 150–400)
RBC: 4.73 MIL/uL (ref 4.22–5.81)
RDW: 13.4 % (ref 11.5–15.5)
WBC: 10.2 10*3/uL (ref 4.0–10.5)
nRBC: 0 % (ref 0.0–0.2)

## 2021-05-25 LAB — RESP PANEL BY RT-PCR (FLU A&B, COVID) ARPGX2
Influenza A by PCR: NEGATIVE
Influenza B by PCR: NEGATIVE
SARS Coronavirus 2 by RT PCR: NEGATIVE

## 2021-05-25 LAB — CREATININE, SERUM
Creatinine, Ser: 0.85 mg/dL (ref 0.61–1.24)
GFR, Estimated: >60 mL/min (ref >60)

## 2021-05-25 LAB — CBG MONITORING, ED
Glucose-Capillary: 179 mg/dL — ABNORMAL HIGH (ref 70–99)
Glucose-Capillary: 179 mg/dL — ABNORMAL HIGH (ref 70–99)

## 2021-05-25 LAB — CK: Total CK: 214 U/L (ref 49–397)

## 2021-05-25 LAB — TROPONIN I (HIGH SENSITIVITY)
Troponin I (High Sensitivity): 7 ng/L (ref <18)
Troponin I (High Sensitivity): 7 ng/L (ref <18)

## 2021-05-25 MED ORDER — ACETAMINOPHEN 650 MG RE SUPP: 650.0000 mg | Freq: Four times a day (QID) | RECTAL | Status: DC | PRN

## 2021-05-25 MED ORDER — ASPIRIN EC 81 MG PO TBEC: 81.0000 mg | DELAYED_RELEASE_TABLET | Freq: Every day | ORAL | Status: DC

## 2021-05-25 MED ORDER — CARVEDILOL 25 MG PO TABS
25.0000 mg | ORAL_TABLET | Freq: Once | ORAL | Status: AC
Start: 2021-05-25 — End: 2021-05-25
  Administered 2021-05-25: 25 mg via ORAL
  Filled 2021-05-25: qty 1

## 2021-05-25 MED ORDER — ENOXAPARIN SODIUM 40 MG/0.4ML IJ SOSY: 40.0000 mg | PREFILLED_SYRINGE | INTRAMUSCULAR | Status: DC

## 2021-05-25 MED ORDER — ALBUTEROL SULFATE (2.5 MG/3ML) 0.083% IN NEBU
2.5000 mg | INHALATION_SOLUTION | RESPIRATORY_TRACT | Status: DC | PRN
  Administered 2021-05-26: 2.5 mg via RESPIRATORY_TRACT
  Filled 2021-05-25: qty 3

## 2021-05-25 MED ORDER — ONDANSETRON HCL 4 MG/2ML IJ SOLN
4.0000 mg | Freq: Once | INTRAMUSCULAR | Status: AC
  Administered 2021-05-25: 4 mg via INTRAVENOUS
  Filled 2021-05-25: qty 2

## 2021-05-25 MED ORDER — IPRATROPIUM-ALBUTEROL 0.5-2.5 (3) MG/3ML IN SOLN
3.0000 mL | Freq: Four times a day (QID) | RESPIRATORY_TRACT | Status: DC
  Administered 2021-05-25 – 2021-05-26 (×3): 3 mL via RESPIRATORY_TRACT
  Filled 2021-05-25 (×3): qty 3

## 2021-05-25 MED ORDER — MORPHINE SULFATE (PF) 4 MG/ML IV SOLN
4.0000 mg | Freq: Once | INTRAVENOUS | Status: AC
  Administered 2021-05-25: 4 mg via INTRAVENOUS
  Filled 2021-05-25: qty 1

## 2021-05-25 MED ORDER — ASPIRIN 81 MG PO CHEW: 324.0000 mg | CHEWABLE_TABLET | Freq: Once | ORAL | Status: DC

## 2021-05-25 MED ORDER — CARBIDOPA-LEVODOPA 25-100 MG PO TABS
3.0000 | ORAL_TABLET | Freq: Four times a day (QID) | ORAL | Status: DC
  Administered 2021-05-25 – 2021-05-26 (×3): 3 via ORAL
  Filled 2021-05-25 (×6): qty 3

## 2021-05-25 MED ORDER — ESCITALOPRAM OXALATE 10 MG PO TABS
10.0000 mg | ORAL_TABLET | Freq: Every day | ORAL | Status: DC
  Administered 2021-05-25 – 2021-05-26 (×2): 10 mg via ORAL
  Filled 2021-05-25 (×2): qty 1

## 2021-05-25 MED ORDER — CARVEDILOL 6.25 MG PO TABS
25.0000 mg | ORAL_TABLET | Freq: Two times a day (BID) | ORAL | Status: DC
  Administered 2021-05-25 – 2021-05-26 (×2): 25 mg via ORAL
  Filled 2021-05-25: qty 1
  Filled 2021-05-25: qty 4

## 2021-05-25 MED ORDER — ASPIRIN EC 81 MG PO TBEC
81.0000 mg | DELAYED_RELEASE_TABLET | Freq: Every day | ORAL | Status: DC
  Administered 2021-05-26: 81 mg via ORAL
  Filled 2021-05-25: qty 1

## 2021-05-25 MED ORDER — HYDRALAZINE HCL 20 MG/ML IJ SOLN: 10.0000 mg | Freq: Four times a day (QID) | INTRAMUSCULAR | Status: DC | PRN

## 2021-05-25 MED ORDER — ACETAMINOPHEN 325 MG PO TABS: 650.0000 mg | ORAL_TABLET | Freq: Four times a day (QID) | ORAL | Status: DC | PRN

## 2021-05-25 MED ORDER — ATORVASTATIN CALCIUM 80 MG PO TABS
80.0000 mg | ORAL_TABLET | Freq: Every day | ORAL | Status: DC
  Administered 2021-05-25: 80 mg via ORAL
  Filled 2021-05-25 (×2): qty 1

## 2021-05-25 MED ORDER — IPRATROPIUM-ALBUTEROL 0.5-2.5 (3) MG/3ML IN SOLN
3.0000 mL | Freq: Once | RESPIRATORY_TRACT | Status: AC
  Administered 2021-05-25: 3 mL via RESPIRATORY_TRACT
  Filled 2021-05-25: qty 3

## 2021-05-25 MED ORDER — ATORVASTATIN CALCIUM 40 MG PO TABS
80.0000 mg | ORAL_TABLET | Freq: Every day | ORAL | Status: DC
  Filled 2021-05-25 (×2): qty 2

## 2021-05-25 MED ORDER — ONDANSETRON HCL 4 MG PO TABS: 4.0000 mg | ORAL_TABLET | Freq: Four times a day (QID) | ORAL | Status: DC | PRN

## 2021-05-25 MED ORDER — VITAMIN D 25 MCG (1000 UNIT) PO TABS
1000.0000 [IU] | ORAL_TABLET | Freq: Every day | ORAL | Status: DC
  Administered 2021-05-25 – 2021-05-26 (×2): 1000 [IU] via ORAL
  Filled 2021-05-25 (×2): qty 1

## 2021-05-25 MED ORDER — LORATADINE 10 MG PO TABS
10.0000 mg | ORAL_TABLET | Freq: Every day | ORAL | Status: DC
  Administered 2021-05-25 – 2021-05-26 (×2): 10 mg via ORAL
  Filled 2021-05-25 (×2): qty 1

## 2021-05-25 MED ORDER — METHOCARBAMOL 500 MG PO TABS
500.0000 mg | ORAL_TABLET | Freq: Three times a day (TID) | ORAL | Status: DC | PRN
  Administered 2021-05-25: 500 mg via ORAL
  Filled 2021-05-25 (×3): qty 1

## 2021-05-25 MED ORDER — MORPHINE SULFATE (PF) 2 MG/ML IV SOLN: 2.0000 mg | INTRAVENOUS | Status: DC | PRN

## 2021-05-25 MED ORDER — MORPHINE SULFATE (PF) 2 MG/ML IV SOLN
2.0000 mg | Freq: Once | INTRAVENOUS | Status: AC
  Administered 2021-05-25: 2 mg via INTRAVENOUS
  Filled 2021-05-25: qty 1

## 2021-05-25 MED ORDER — CARBIDOPA-LEVODOPA 10-100 MG PO TABS
3.0000 | ORAL_TABLET | Freq: Three times a day (TID) | ORAL | Status: DC
  Filled 2021-05-25: qty 3

## 2021-05-25 MED ORDER — ATORVASTATIN CALCIUM 20 MG PO TABS
80.0000 mg | ORAL_TABLET | Freq: Every day | ORAL | Status: DC
  Filled 2021-05-25: qty 4

## 2021-05-25 MED ORDER — NITROGLYCERIN 2 % TD OINT
1.0000 [in_us] | TOPICAL_OINTMENT | Freq: Once | TRANSDERMAL | Status: AC
  Administered 2021-05-25: 1 [in_us] via TOPICAL
  Filled 2021-05-25: qty 1

## 2021-05-25 MED ORDER — AMLODIPINE BESYLATE 5 MG PO TABS
5.0000 mg | ORAL_TABLET | Freq: Every day | ORAL | Status: DC
  Administered 2021-05-25 – 2021-05-26 (×2): 5 mg via ORAL
  Filled 2021-05-25 (×2): qty 1

## 2021-05-25 MED ORDER — ONDANSETRON HCL 4 MG/2ML IJ SOLN: 4.0000 mg | Freq: Four times a day (QID) | INTRAMUSCULAR | Status: DC | PRN

## 2021-05-25 MED ORDER — PREDNISONE 10 MG (21) PO TBPK: 10.0000 mg | ORAL_TABLET | ORAL | Status: DC

## 2021-05-25 MED ORDER — ENOXAPARIN SODIUM 40 MG/0.4ML IJ SOSY
40.0000 mg | PREFILLED_SYRINGE | INTRAMUSCULAR | Status: DC
  Administered 2021-05-25: 40 mg via SUBCUTANEOUS
  Filled 2021-05-25: qty 0.4

## 2021-05-25 MED ORDER — NITROGLYCERIN 2 % TD OINT: 1.0000 [in_us] | TOPICAL_OINTMENT | Freq: Once | TRANSDERMAL | Status: DC

## 2021-05-25 MED ORDER — AMANTADINE HCL 100 MG PO CAPS
100.0000 mg | ORAL_CAPSULE | Freq: Every day | ORAL | Status: DC
  Administered 2021-05-25 – 2021-05-26 (×2): 100 mg via ORAL
  Filled 2021-05-25 (×2): qty 1

## 2021-05-25 MED ORDER — CLONAZEPAM 0.5 MG PO TABS
0.5000 mg | ORAL_TABLET | Freq: Every day | ORAL | Status: DC
  Administered 2021-05-25: 0.5 mg via ORAL
  Filled 2021-05-25: qty 1

## 2021-05-25 MED ORDER — ATORVASTATIN CALCIUM 20 MG PO TABS: 20.0000 mg | ORAL_TABLET | Freq: Every day | ORAL | Status: DC

## 2021-05-25 MED ORDER — NITROGLYCERIN 0.4 MG SL SUBL: 0.4000 mg | SUBLINGUAL_TABLET | SUBLINGUAL | Status: DC | PRN

## 2021-05-25 MED ORDER — ADULT MULTIVITAMIN W/MINERALS CH
1.0000 | ORAL_TABLET | Freq: Every day | ORAL | Status: DC
  Administered 2021-05-25 – 2021-05-26 (×2): 1 via ORAL
  Filled 2021-05-25 (×2): qty 1

## 2021-05-25 MED ORDER — BUDESONIDE 0.5 MG/2ML IN SUSP
0.5000 mg | Freq: Two times a day (BID) | RESPIRATORY_TRACT | Status: DC
  Administered 2021-05-25 – 2021-05-26 (×3): 0.5 mg via RESPIRATORY_TRACT
  Filled 2021-05-25 (×3): qty 2

## 2021-05-25 MED ORDER — BENAZEPRIL HCL 20 MG PO TABS
20.0000 mg | ORAL_TABLET | Freq: Every day | ORAL | Status: DC
  Administered 2021-05-25 – 2021-05-26 (×2): 20 mg via ORAL
  Filled 2021-05-25 (×2): qty 1

## 2021-05-25 MED ORDER — PREDNISONE 20 MG PO TABS
60.0000 mg | ORAL_TABLET | Freq: Once | ORAL | Status: AC
  Administered 2021-05-25: 60 mg via ORAL
  Filled 2021-05-25: qty 3

## 2021-05-25 MED ORDER — ASPIRIN 81 MG PO CHEW
324.0000 mg | CHEWABLE_TABLET | Freq: Once | ORAL | Status: AC
  Administered 2021-05-25: 324 mg via ORAL
  Filled 2021-05-25: qty 4

## 2021-05-25 NOTE — ED Notes (Signed)
Pt wheeled in recliner from lobby to restroom and given urinal to void.  Taken back to lobby

## 2021-05-25 NOTE — ED Notes (Signed)
Pt able to stand without assistance. Pt taken to the bathroom by this RN.

## 2021-05-25 NOTE — ED Provider Notes (Signed)
[facility] Emergency Department Provider Note   ____________________________________________   Event Date/Time   First MD Initiated Contact with Patient 05/25/21 [phone]     (approximate)  I have reviewed the triage vital signs and the nursing notes.   HISTORY  Chief Complaint Chest Pain    HPI Jeremiah Taylor. is a 60 y.o. male brought to the ED via EMS from home with a chief complaint of chest pain.  Patient with a history of Parkinson's disease, hypertension, COPD, diabetes who reports left-sided chest pain radiating down his left arm, onset last evening.  States his tremors with Parkinson's increases when he gets worked up for stress.  Missed his nighttime Coreg and Sinemet due to being in the ED.  Denies associated fever, cough, shortness of breath, abdominal pain, nausea, vomiting or dizziness.     Past Medical History:  Diagnosis Date   Anxiety    Asthma    Hypertension    Parkinson disease [facility])     Patient Active Problem List   Diagnosis Date Noted   Chest pain 05/25/2021   COPD with acute exacerbation ([place]) 01/21/2021   Acute respiratory failure with hypoxia ([place]) 01/21/2021   COPD exacerbation ([place]) 01/21/2021   Parkinson's disease ([place]) 02/17/2020   Type 2 diabetes mellitus with other specified complication ([place]) [id number]   HTN (hypertension) 10/04/2006    Past Surgical History:  Procedure Laterality Date   CARPAL TUNNEL RELEASE Right    x3   CHOLECYSTECTOMY     OLECRANON BURSECTOMY Right 04/29/2021   Procedure: Right olecranon bursa excision;  Surgeon: Hessie Knows, MD;  Location: [facility] ORS;  Service: Orthopedics;  Laterality: Right;   REPLACEMENT TOTAL KNEE Right     Prior to Admission medications   Medication Sig Start Date End Date Taking? Authorizing Provider  albuterol (VENTOLIN HFA) 108 (90 Base) MCG/ACT inhaler Inhale 1-2 puffs into the lungs every 6 (six) hours as needed for wheezing or shortness of breath.   Yes  [provider]  Amantadine HCl 100 MG tablet Take 100 mg by mouth daily. 05/12/21 12/01/21 Yes [provider]  amLODipine-benazepril (LOTREL) 10-40 MG capsule Take 1 capsule by mouth daily. 01/07/21  Yes [provider]  atorvastatin (LIPITOR) 80 MG tablet Take 80 mg by mouth at bedtime. 04/24/21  Yes [provider]  budesonide (PULMICORT) 0.5 MG/2ML nebulizer solution Take 0.5 mg by nebulization in the morning and at bedtime. 05/23/21  Yes [provider]  carbidopa-levodopa (SINEMET IR) 25-100 MG tablet Take 3 tablets by mouth 4 (four) times daily.   Yes [provider]  carvedilol (COREG) 12.5 MG tablet Take 12.5 mg by mouth 2 (two) times daily. 05/06/21  Yes [provider]  cetirizine (ZYRTEC) 10 MG tablet Take 10 mg by mouth daily. 12/31/20  Yes [provider]  clonazePAM (KLONOPIN) 0.5 MG tablet Take 0.5 mg by mouth at bedtime. 01/14/21  Yes [provider]  escitalopram (LEXAPRO) 10 MG tablet Take 10 mg by mouth daily. 04/04/21  Yes [provider]  fluticasone (FLONASE) 50 MCG/ACT nasal spray Place 1 spray into the nose 2 (two) times daily as needed for allergies. 12/13/20 12/13/21 Yes [provider]  ibuprofen (ADVIL) 600 MG tablet Take 600 mg by mouth every 8 (eight) hours as needed. 05/05/21  Yes [provider]  predniSONE (STERAPRED UNI-PAK 21 TAB) 10 MG (21) TBPK tablet Take 10 mg by mouth See admin instructions. Take by mouth once daily for 12 days .  6-6-5-5-4-4-3-3-2-2-1-1 05/14/21 05/26/21 Yes [provider]  SYMBICORT 160-4.5 MCG/ACT inhaler Inhale 2 puffs into the lungs 2 (two) times daily. 01/07/21  Yes [provider]  atorvastatin (LIPITOR) 20 MG tablet Take 20 mg by mouth at bedtime. 12/13/20   [provider]  Carbidopa-Levodopa ER 61.25-245 MG CPCR Take 1 tablet by mouth 4 (four) times daily.    [provider]  carvedilol (COREG) 25 MG  tablet Take 25 mg by mouth 2 (two) times daily with a meal.    [provider]  cholecalciferol (VITAMIN D3) 25 MCG (1000 UNIT) tablet Take 1,000 Units by mouth daily.    [provider]  HYDROcodone-acetaminophen (NORCO) 5-325 MG tablet Take 1 tablet by mouth every 6 (six) hours as needed for moderate pain. 04/29/21   Kennedy Bucker, MD  ipratropium (ATROVENT HFA) 17 MCG/ACT inhaler Inhale 2 puffs into the lungs 4 (four) times daily. Patient not taking: Reported on 04/23/2021 08/25/20 08/25/21  Merwyn Katos, MD  ipratropium-albuterol (DUONEB) 0.5-2.5 (3) MG/3ML SOLN Take 3 mLs by nebulization every 4 (four) hours as needed (shortness of breath and/or wheezing). Patient not taking: Reported on 04/23/2021 01/24/21 02/23/21  Charise Killian, MD  Multiple Vitamin (MULTIVITAMIN WITH MINERALS) TABS tablet Take 1 tablet by mouth daily.    [provider]    Allergies Patient has no known allergies.  History reviewed. No pertinent family history.  Social History Social History   Tobacco Use   Smoking status: Former   Smokeless tobacco: Never  Building services engineer Use: Never used  Substance Use Topics   Alcohol use: Not Currently   Drug use: Not Currently    Review of Systems  Constitutional: No fever/chills Eyes: No visual changes. ENT: No sore throat. Cardiovascular: Positive for chest pain. Respiratory: Denies shortness of breath. Gastrointestinal: No abdominal pain.  No nausea, no vomiting.  No diarrhea.  No constipation. Genitourinary: Negative for dysuria. Musculoskeletal: Negative for back pain. Skin: Negative for rash. Neurological: Positive for increased tremors.  Negative for headaches, focal weakness or numbness.   ____________________________________________   PHYSICAL EXAM:  VITAL SIGNS: ED Triage Vitals  Enc Vitals Group     BP 05/24/21 2320 (!) 146/106     Pulse Rate 05/24/21 2320 86     Resp 05/24/21 2320 (!) 30     Temp 05/24/21  2320 98.5 F (36.9 C)     Temp Source 05/24/21 2320 Oral     SpO2 05/24/21 2320 96 %     Weight 05/24/21 2317 190 lb (86.2 kg)     Height 05/24/21 2317 5\' 11"  (1.803 m)     Head Circumference --      Peak Flow --      Pain Score 05/24/21 2317 10     Pain Loc --      Pain Edu? --      Excl. in GC? --     Constitutional: Alert and oriented.  Appears older than stated age and in mild acute distress. Eyes: Conjunctivae are normal. PERRL. EOMI. Head: Atraumatic. Nose: No congestion/rhinnorhea. Mouth/Throat: Mucous membranes are mildly dry. Neck: No stridor.   Cardiovascular: Normal rate, regular rhythm. Grossly normal heart sounds.  Good peripheral circulation. Respiratory: Normal respiratory effort.  No retractions. Lungs CTAB. Gastrointestinal: Soft and nontender. No distention. No abdominal bruits. No CVA tenderness. Musculoskeletal: No lower extremity tenderness nor edema.  No joint effusions. Neurologic:  Normal speech and language. No gross focal neurologic deficits are appreciated.  Skin:  Skin is warm, dry and intact. No rash noted. Psychiatric: Mood and affect are normal. Speech and behavior are normal.  ____________________________________________   LABS (all labs ordered are listed, but only abnormal results are displayed)  Labs Reviewed  RESP PANEL BY RT-PCR (FLU A&B, COVID) ARPGX2  BASIC METABOLIC PANEL  CBC  CK  CBC  CREATININE, SERUM  TROPONIN I (HIGH SENSITIVITY)  TROPONIN I (HIGH SENSITIVITY)   ____________________________________________  EKG  ED ECG REPORT I, Hanford Lust J, the attending physician, personally viewed and interpreted this ECG.   Date: 05/25/2021  EKG Time: 2322  Rate: 86  Rhythm: normal sinus rhythm  Axis: Normal  Intervals:none  ST&T Change: Nonspecific  ____________________________________________  RADIOLOGY I, Tobyn Osgood J, personally viewed and evaluated these images (plain radiographs) as part of my medical decision making,  as well as reviewing the written report by the radiologist.  ED MD interpretation: No acute cardiopulmonary process; no ICH  Official radiology report(s): CT Head Wo Contrast  Result Date: 05/25/2021 CLINICAL DATA:  60 year old male with Parkinson's. New or increasing headache. EXAM: CT HEAD WITHOUT CONTRAST TECHNIQUE: Contiguous axial images were obtained from the base of the skull through the vertex without intravenous contrast. COMPARISON:  Head CT 12/10/2020. FINDINGS: Brain: Cerebral volume remains normal for age. No midline shift, ventriculomegaly, mass effect, evidence of mass lesion, intracranial hemorrhage or evidence of cortically based acute infarction. Gray-white matter differentiation is within normal limits throughout the brain. Vascular: Calcified atherosclerosis at the skull base. No suspicious intracranial vascular hyperdensity. Skull: No acute osseous abnormality identified. Sinuses/Orbits: Increased mild right mastoid effusion. Right tympanic cavity also partially opacified now. Nasopharynx not included. Other Visualized paranasal sinuses and mastoids are stable and well aerated. Other: No acute orbit or scalp soft tissue finding. IMPRESSION: 1. Stable and normal for age non contrast CT appearance of the brain. 2. New partial opacification of the right tympanic cavity, and increased right mastoid effusion since June. Consider otitis media and/or right side eustachian tube dysfunction. Electronically Signed   By: Genevie Ann M.D.   On: 05/25/2021 06:29   DG Chest Port 1 View  Result Date: 05/25/2021 CLINICAL DATA:  Chest pain EXAM: PORTABLE CHEST 1 VIEW COMPARISON:  01/20/2021 FINDINGS: Lungs are clear.  No pleural effusion or pneumothorax. The heart is normal in size. IMPRESSION: No evidence of acute cardiopulmonary disease. Electronically Signed   By: Julian Hy M.D.   On: 05/25/2021 00:05    ____________________________________________   PROCEDURES  Procedure(s) performed  (including Critical Care):  .1-3 Lead EKG Interpretation Performed by: Paulette Blanch, MD Authorized by: Paulette Blanch, MD     Interpretation: normal     ECG rate:  78   ECG rate assessment: normal     Rhythm: sinus rhythm     Ectopy: none     Conduction: normal   Comments:     Patient placed on cardiac monitor to evaluate for arrhythmias  CRITICAL CARE Performed by: Paulette Blanch   Total critical care time: 30 minutes  Critical care time was exclusive of separately billable procedures and treating other patients.  Critical care was necessary to treat or prevent imminent or life-threatening deterioration.  Critical care was time spent personally by me on the following activities: development of treatment plan with patient and/or surrogate as well as nursing, discussions with consultants, evaluation of patient's response to treatment, examination of patient, obtaining history from patient or surrogate, ordering and performing treatments and interventions, ordering and review of laboratory  studies, ordering and review of radiographic studies, pulse oximetry and re-evaluation of patient's condition.    ____________________________________________   INITIAL IMPRESSION / ASSESSMENT AND PLAN / ED COURSE  As part of my medical decision making, I reviewed the following data within the [place] notes reviewed and incorporated, Labs reviewed, EKG interpreted, Old chart reviewed, Radiograph reviewed, Discussed with admitting physician, and Notes from prior ED visits     60 year old male presenting with chest pain. Differential diagnosis includes, but is not limited to, ACS, aortic dissection, pulmonary embolism, cardiac tamponade, pneumothorax, pneumonia, pericarditis, myocarditis, GI-related causes including esophagitis/gastritis, and musculoskeletal chest wall pain.     Initial troponin unremarkable.  Blood pressure extremely elevated, will administer patient's  nighttime Coreg and additionally give aspirin as well as nitroglycerin paste.  Will administer patient's nighttime Sinemet for increased parkinsonian tremors.  Will check CK.  Patient developed COPD exacerbation with wheezing while awaiting treatment room and was given DuoNeb.  Will also start prednisone.  Will discuss with hospitalist services for chest pain evaluation, hypertensive urgency and COPD exacerbation.  Clinical Course as of 05/25/21 [id number]  Nancy Fetter May 25, 2021  0533 Patient now complaining of headache as well.  Given severely elevated blood pressure, will obtain CT head.  Hold aspirin until CT results and nitroglycerin and instead will administer morphine for chest pain. [JS]    Clinical Course User Index [JS] Paulette Blanch, MD     ____________________________________________   FINAL CLINICAL IMPRESSION(S) / ED DIAGNOSES  Final diagnoses:  Chest pain, unspecified type  Hypertensive urgency  COPD exacerbation ([place])  Parkinson disease [facility])     ED Discharge Orders     None        Note:  This document was prepared using Dragon voice recognition software and may include unintentional dictation errors.    Paulette Blanch, MD 05/25/21 [phone]

## 2021-05-25 NOTE — H&P (Signed)
History and Physical    Jeremiah Taylor. WJX:[id number] DOB: 15-May-1961 DOA: 05/25/2021  PCP: Enid Baas, MD   Patient coming from: Home  I have personally briefly reviewed patient's old medical records in [facility]  Chief Complaint: Left arm pain/chest pain  HPI: Oseas Detty. is a 60 y.o. male with medical history significant for Parkinson's disease, hypertension, dyslipidemia, borderline diabetes mellitus who presents to the ER for evaluation of pain which started in his left arm about 2 days prior to his admission.  Left arm pain has been intermittent and not related to rest or exertion.  1 day prior to his admission the left arm pain radiated to the left anterior chest wall and was associated with diaphoresis and nausea.  He denied having any palpitations or shortness of breath.  Chest pain was rated a 5 x 10 in intensity at its worst and was associated with minimal exertion.  He had some relief of his pain with morphine but not completely resolved. Patient had a normal nuclear medicine myocardial perfusion scan in 10/21. He had a cardiac cath done 02/22 which showed diffuse mild to moderate CAD.  No severe stenoses appreciated.  Worst disease is a 60% lesion in a medium size diagonal branch of the LAD. He denies having any fever, no cough, no shortness of breath, no abdominal pain, no nausea, no vomiting, no dizziness, no lightheadedness, urinary symptoms, no blurred vision or focal deficit. Sodium 135, potassium 3.9, chloride 91, bicarb 25, glucose 80, BUN 18, creatinine 0.9, calcium 9.4, troponin 7, white count 10.2, hemoglobin 15.1, hematocrit 45.5, MCV 96.2, RDW 13.4, platelet count 287 Respiratory viral panel is pending Chest x-ray reviewed by me shows no evidence of acute cardiopulmonary disease CT scan of the head without contrast is normal for age.  New partial opacification of the right tympanic cavity and increased right mastoid effusion since June.  Consider  otitis media or right-sided eustachian tube dysfunction. Twelve-lead EKG reviewed by me shows sinus rhythm with nonspecific ST and T wave changes.   ED Course: Patient is a 60 year old male who presents to the ER for evaluation of a 2-day history of left arm pain with radiation to his left anterior chest wall associated with diaphoresis and nausea. Twelve-lead EKG did not show any acute ST or T wave changes. Initial troponin is negative Patient will be referred to observation status for further evaluation.   Review of Systems: As per HPI otherwise all other systems reviewed and negative.    Past Medical History:  Diagnosis Date   Anxiety    Asthma    Hypertension    Parkinson disease (HCC)     Past Surgical History:  Procedure Laterality Date   CARPAL TUNNEL RELEASE Right    x3   CHOLECYSTECTOMY     OLECRANON BURSECTOMY Right 04/29/2021   Procedure: Right olecranon bursa excision;  Surgeon: Kennedy Bucker, MD;  Location: [facility] ORS;  Service: Orthopedics;  Laterality: Right;   REPLACEMENT TOTAL KNEE Right      reports that he has quit smoking. He has never used smokeless tobacco. He reports that he does not currently use alcohol. He reports that he does not currently use drugs.  No Known Allergies  Family History  Problem Relation Age of Onset   Hypertension Mother       Prior to Admission medications   Medication Sig Start Date End Date Taking? Authorizing Provider  albuterol (VENTOLIN HFA) 108 (90 Base) MCG/ACT inhaler Inhale 1-2 puffs into  the lungs every 6 (six) hours as needed for wheezing or shortness of breath.   Yes [provider]  Amantadine HCl 100 MG tablet Take 100 mg by mouth daily. 05/12/21 12/01/21 Yes [provider]  amLODipine-benazepril (LOTREL) 10-40 MG capsule Take 1 capsule by mouth daily. 01/07/21  Yes [provider]  atorvastatin (LIPITOR) 80 MG tablet Take 80 mg by mouth at bedtime. 04/24/21  Yes [provider]   budesonide (PULMICORT) 0.5 MG/2ML nebulizer solution Take 0.5 mg by nebulization in the morning and at bedtime. 05/23/21  Yes [provider]  carbidopa-levodopa (SINEMET IR) 25-100 MG tablet Take 3 tablets by mouth 4 (four) times daily.   Yes [provider]  carvedilol (COREG) 12.5 MG tablet Take 12.5 mg by mouth 2 (two) times daily. 05/06/21  Yes [provider]  cetirizine (ZYRTEC) 10 MG tablet Take 10 mg by mouth daily. 12/31/20  Yes [provider]  clonazePAM (KLONOPIN) 0.5 MG tablet Take 0.5 mg by mouth at bedtime. 01/14/21  Yes [provider]  escitalopram (LEXAPRO) 10 MG tablet Take 10 mg by mouth daily. 04/04/21  Yes [provider]  fluticasone (FLONASE) 50 MCG/ACT nasal spray Place 1 spray into the nose 2 (two) times daily as needed for allergies. 12/13/20 12/13/21 Yes [provider]  ibuprofen (ADVIL) 600 MG tablet Take 600 mg by mouth every 8 (eight) hours as needed. 05/05/21  Yes [provider]  predniSONE (STERAPRED UNI-PAK 21 TAB) 10 MG (21) TBPK tablet Take 10 mg by mouth See admin instructions. Take by mouth once daily for 12 days .6-6-5-5-4-4-3-3-2-2-1-1 05/14/21 05/26/21 Yes [provider]  SYMBICORT 160-4.5 MCG/ACT inhaler Inhale 2 puffs into the lungs 2 (two) times daily. 01/07/21  Yes [provider]  atorvastatin (LIPITOR) 20 MG tablet Take 20 mg by mouth at bedtime. 12/13/20   [provider]  Carbidopa-Levodopa ER 61.25-245 MG CPCR Take 1 tablet by mouth 4 (four) times daily.    [provider]  carvedilol (COREG) 25 MG tablet Take 25 mg by mouth 2 (two) times daily with a meal.    [provider]  cholecalciferol (VITAMIN D3) 25 MCG (1000 UNIT) tablet Take 1,000 Units by mouth daily.    [provider]  HYDROcodone-acetaminophen (NORCO) 5-325 MG tablet Take 1 tablet by mouth every 6 (six) hours as needed for moderate pain. 04/29/21   Kennedy BuckerMenz, Michael, MD   ipratropium (ATROVENT HFA) 17 MCG/ACT inhaler Inhale 2 puffs into the lungs 4 (four) times daily. Patient not taking: Reported on 04/23/2021 08/25/20 08/25/21  Merwyn KatosBradler, Evan K, MD  ipratropium-albuterol (DUONEB) 0.5-2.5 (3) MG/3ML SOLN Take 3 mLs by nebulization every 4 (four) hours as needed (shortness of breath and/or wheezing). Patient not taking: Reported on 04/23/2021 01/24/21 02/23/21  Charise KillianWilliams, Jamiese M, MD  Multiple Vitamin (MULTIVITAMIN WITH MINERALS) TABS tablet Take 1 tablet by mouth daily.    [provider]    Physical Exam: Vitals:   05/25/21 0730 05/25/21 0800 05/25/21 0830 05/25/21 0900  BP: (!) 139/103 (!) 124/96 (!) 132/95 132/88  Pulse: 66 71 65 67  Resp:   15 13  Temp:      TempSrc:      SpO2: 95% 95% 99% 95%  Weight:      Height:         Vitals:   05/25/21 0730 05/25/21 0800 05/25/21 0830 05/25/21 0900  BP: (!) 139/103 (!) 124/96 (!) 132/95 132/88  Pulse: 66 71 65 67  Resp:  15 13  Temp:      TempSrc:      SpO2: 95% 95% 99% 95%  Weight:      Height:          Constitutional: Alert and oriented x 3 .  Appears uncomfortable HEENT:      Head: Normocephalic and atraumatic.         Eyes: PERLA, EOMI, Conjunctivae are normal. Sclera is non-icteric.       Mouth/Throat: Mucous membranes are moist.       Neck: Supple with no signs of meningismus. Cardiovascular: Regular rate and rhythm. No murmurs, gallops, or rubs. 2+ symmetrical distal pulses are present . No JVD. No LE edema Respiratory: Respiratory effort normal .diffuse wheezes in all lung fields bilaterally. No crackles, or rhonchi.  Gastrointestinal: Soft, non tender, and non distended with positive bowel sounds.  Genitourinary: No CVA tenderness. Musculoskeletal: Nontender with normal range of motion in all extremities. No cyanosis, or erythema of extremities. Neurologic:  Face is symmetric. Moving all extremities. No gross focal neurologic deficits . Skin: Skin is warm, dry.  No rash or  ulcers Psychiatric: Mood and affect are normal    Labs on Admission: I have personally reviewed following labs and imaging studies  CBC: Recent Labs  Lab 05/24/21 2325 05/25/21 0737  WBC 10.4 10.2  HGB 15.1 15.5  HCT 43.6 45.5  MCV 95.4 96.2  PLT 295 [id number]   Basic Metabolic Panel: Recent Labs  Lab 05/24/21 2325 05/25/21 0737  NA 135  --   K 3.9  --   CL 101  --   CO2 25  --   GLUCOSE 80  --   BUN 19  --   CREATININE 0.99 0.85  CALCIUM 9.4  --    GFR: Estimated Creatinine Clearance: 96 mL/min (by C-G formula based on SCr of 0.85 mg/dL). Liver Function Tests: No results for input(s): AST, ALT, ALKPHOS, BILITOT, PROT, ALBUMIN in the last 168 hours. No results for input(s): LIPASE, AMYLASE in the last 168 hours. No results for input(s): AMMONIA in the last 168 hours. Coagulation Profile: No results for input(s): INR, PROTIME in the last 168 hours. Cardiac Enzymes: Recent Labs  Lab 05/25/21 0148  CKTOTAL 214   BNP (last 3 results) No results for input(s): PROBNP in the last 8760 hours. HbA1C: No results for input(s): HGBA1C in the last 72 hours. CBG: No results for input(s): GLUCAP in the last 168 hours. Lipid Profile: No results for input(s): CHOL, HDL, LDLCALC, TRIG, CHOLHDL, LDLDIRECT in the last 72 hours. Thyroid Function Tests: No results for input(s): TSH, T4TOTAL, FREET4, T3FREE, THYROIDAB in the last 72 hours. Anemia Panel: No results for input(s): VITAMINB12, FOLATE, FERRITIN, TIBC, IRON, RETICCTPCT in the last 72 hours. Urine analysis: No results found for: COLORURINE, APPEARANCEUR, LABSPEC, PHURINE, GLUCOSEU, HGBUR, BILIRUBINUR, KETONESUR, PROTEINUR, UROBILINOGEN, NITRITE, LEUKOCYTESUR  Radiological Exams on Admission: CT Head Wo Contrast  Result Date: 05/25/2021 CLINICAL DATA:  60 year old male with Parkinson's. New or increasing headache. EXAM: CT HEAD WITHOUT CONTRAST TECHNIQUE: Contiguous axial images were obtained from the base of the skull  through the vertex without intravenous contrast. COMPARISON:  Head CT 12/10/2020. FINDINGS: Brain: Cerebral volume remains normal for age. No midline shift, ventriculomegaly, mass effect, evidence of mass lesion, intracranial hemorrhage or evidence of cortically based acute infarction. Gray-white matter differentiation is within normal limits throughout the brain. Vascular: Calcified atherosclerosis at the skull base. No suspicious intracranial vascular hyperdensity. Skull: No acute osseous abnormality identified. Sinuses/Orbits: Increased  mild right mastoid effusion. Right tympanic cavity also partially opacified now. Nasopharynx not included. Other Visualized paranasal sinuses and mastoids are stable and well aerated. Other: No acute orbit or scalp soft tissue finding. IMPRESSION: 1. Stable and normal for age non contrast CT appearance of the brain. 2. New partial opacification of the right tympanic cavity, and increased right mastoid effusion since June. Consider otitis media and/or right side eustachian tube dysfunction. Electronically Signed   By: Genevie Ann M.D.   On: 05/25/2021 06:29   DG Chest Port 1 View  Result Date: 05/25/2021 CLINICAL DATA:  Chest pain EXAM: PORTABLE CHEST 1 VIEW COMPARISON:  01/20/2021 FINDINGS: Lungs are clear.  No pleural effusion or pneumothorax. The heart is normal in size. IMPRESSION: No evidence of acute cardiopulmonary disease. Electronically Signed   By: Julian Hy M.D.   On: 05/25/2021 00:05     Assessment/Plan Principal Problem:   Unstable angina (HCC) Active Problems:   HTN (hypertension)   Parkinson's disease (HCC)   Type 2 diabetes mellitus with other specified complication ([place])   Chest pain   CAD (coronary artery disease)   Anxiety      Patient is a 60 year old male who presents for evaluation of chest pain.   Chest pain Concerning for unstable angina in a patient with known coronary artery disease and history of diabetes  mellitus Twelve-lead EKG does not show any acute findings and initial troponin is negative We will cycle cardiac enzymes Continue aspirin, high intensity statins, beta-blockers and as needed nitrates Obtain 2D echocardiogram to assess LVEF and rule out regional wall motion abnormality    Hypertension Uncontrolled Continue carvedilol, benazepril and amlodipine    COPD with acute exacerbation Patient noted to have diffuse wheezes on exam Continue scheduled and as needed bronchodilator therapy Place patient on Pulmicort twice daily Continue systemic steroids started as an outpatient     Depression/anxiety Continue Lexapro and Klonopin    Parkinson's disease Continue amantadine and Sinemet    Type 2 diabetes mellitus Maintain consistent carbohydrate diet Check blood sugars AC meals   DVT prophylaxis: Lovenox  Code Status: full code  Family Communication:  none  Disposition Plan: Back to previous home environment Consults called: Cardiology Status:Observation    Cecilia Nishikawa MD [facility]     05/25/2021, 9:40 AM

## 2021-05-25 NOTE — ED Notes (Signed)
Pt with ntoed decreased tremors from when he arrived. Pt appears in less distress than when he arrived. Pt c/o continued L shoulder pain at this time, states he thinks it is a pinched nerve.

## 2021-05-25 NOTE — ED Notes (Signed)
Pt with audible wheezing heard at this time. MD notified, orders placed for a breathing treatment at this time.

## 2021-05-25 NOTE — Consult Note (Signed)
[facility] Cardiology  CARDIOLOGY CONSULT NOTE  Patient ID: Tretha Sciara. MRN: [id number] DOB/AGE: 1960-11-04 60 y.o.  Admit date: 05/25/2021 Referring Physician [facility] Primary Physician [facility] Primary Cardiologist  Reason for Consultation chest pain  HPI: 60 year old gentleman referred for evaluation of chest pain.  He presents to [facility] ED with history of intermittent sharp pain in his left arm unrelated to exertion.  Left arm pain radiated to his left chest 1 day prior to admission.  In the emergency room, the patient rated his chest pain as 5 out of 10.  ECG was nondiagnostic.  High-sensitivity troponin was normal (7, 7).  Patient currently denies chest pain.  He does report mild chest tightness with history of COPD, quit smoking 3 months ago.  The patient underwent cardiac catheterization 07/24/2020 at [facility] which revealed insignificant coronary artery disease with 60% stenosis diagonal branch.  2D echocardiogram 02/17/2020 revealed normal left ventricular function, with LVEF greater than 55%.  Review of systems complete and found to be negative unless listed above     Past Medical History:  Diagnosis Date   Anxiety    Asthma    Hypertension    Parkinson disease (HCC)     Past Surgical History:  Procedure Laterality Date   CARPAL TUNNEL RELEASE Right    x3   CHOLECYSTECTOMY     OLECRANON BURSECTOMY Right 04/29/2021   Procedure: Right olecranon bursa excision;  Surgeon: Kennedy Bucker, MD;  Location: [facility] ORS;  Service: Orthopedics;  Laterality: Right;   REPLACEMENT TOTAL KNEE Right     (Not in a hospital admission)  Social History   Socioeconomic History   Marital status: Married    Spouse name: Not on file   Number of children: Not on file   Years of education: Not on file   Highest education level: Not on file  Occupational History   Not on file  Tobacco Use   Smoking status: Former   Smokeless tobacco: Never  Vaping Use   Vaping Use: Never used  Substance and Sexual  Activity   Alcohol use: Not Currently   Drug use: Not Currently   Sexual activity: Not on file  Other Topics Concern   Not on file  Social History Narrative   Not on file   Social Determinants of Health   Financial Resource Strain: Not on file  Food Insecurity: Not on file  Transportation Needs: Not on file  Physical Activity: Not on file  Stress: Not on file  Social Connections: Not on file  Intimate Partner Violence: Not on file    Family History  Problem Relation Age of Onset   Hypertension Mother       Review of systems complete and found to be negative unless listed above      PHYSICAL EXAM  General: Well developed, well nourished, in no acute distress HEENT:  Normocephalic and atramatic Neck:  No JVD.  Lungs: Clear bilaterally to auscultation and percussion. Heart: HRRR . Normal S1 and S2 without gallops or murmurs.  Abdomen: Bowel sounds are positive, abdomen soft and non-tender  Msk:  Back normal, normal gait. Normal strength and tone for age. Extremities: No clubbing, cyanosis or edema.   Neuro: Alert and oriented X 3. Psych:  Good affect, responds appropriately  Labs:   Lab Results  Component Value Date   WBC 10.2 05/25/2021   HGB 15.5 05/25/2021   HCT 45.5 05/25/2021   MCV 96.2 05/25/2021   PLT 287 05/25/2021    Recent Labs  Lab 05/24/21 2325 05/25/21 0737  NA 135  --   K 3.9  --   CL 101  --   CO2 25  --   BUN 19  --   CREATININE 0.99 0.85  CALCIUM 9.4  --   GLUCOSE 80  --    Lab Results  Component Value Date   CKTOTAL 214 05/25/2021   No results found for: CHOL No results found for: HDL No results found for: LDLCALC No results found for: TRIG No results found for: CHOLHDL No results found for: LDLDIRECT    Radiology: CT Head Wo Contrast  Result Date: 05/25/2021 CLINICAL DATA:  60 year old male with Parkinson's. New or increasing headache. EXAM: CT HEAD WITHOUT CONTRAST TECHNIQUE: Contiguous axial images were obtained from the  base of the skull through the vertex without intravenous contrast. COMPARISON:  Head CT 12/10/2020. FINDINGS: Brain: Cerebral volume remains normal for age. No midline shift, ventriculomegaly, mass effect, evidence of mass lesion, intracranial hemorrhage or evidence of cortically based acute infarction. Gray-white matter differentiation is within normal limits throughout the brain. Vascular: Calcified atherosclerosis at the skull base. No suspicious intracranial vascular hyperdensity. Skull: No acute osseous abnormality identified. Sinuses/Orbits: Increased mild right mastoid effusion. Right tympanic cavity also partially opacified now. Nasopharynx not included. Other Visualized paranasal sinuses and mastoids are stable and well aerated. Other: No acute orbit or scalp soft tissue finding. IMPRESSION: 1. Stable and normal for age non contrast CT appearance of the brain. 2. New partial opacification of the right tympanic cavity, and increased right mastoid effusion since June. Consider otitis media and/or right side eustachian tube dysfunction. Electronically Signed   By: Odessa Fleming M.D.   On: 05/25/2021 06:29   DG Chest Port 1 View  Result Date: 05/25/2021 CLINICAL DATA:  Chest pain EXAM: PORTABLE CHEST 1 VIEW COMPARISON:  01/20/2021 FINDINGS: Lungs are clear.  No pleural effusion or pneumothorax. The heart is normal in size. IMPRESSION: No evidence of acute cardiopulmonary disease. Electronically Signed   By: Charline Bills M.D.   On: 05/25/2021 00:05    EKG: Sinus rhythm with nonspecific ST-T abnormalities  ASSESSMENT AND PLAN:   1.  Chest pain, with atypical features, normal high-sensitivity troponin, nondiagnostic ECG, with recent cardiac catheterization 07/24/2020 revealing insignificant coronary artery disease.  Chest pain very likely noncardiac in nature. 2.  Essential hypertension, pressure initially elevated, now in normal range, on amlodipine and benazepril 3.  COPD, recently quit  smoking  Recommendations  1.  Agree with current therapy 2.  Defer full dose anticoagulation 3.  Defer further cardiac diagnostics at this time 4.  Continue amlodipine and benazepril 5.  Consider outpatient evaluation for noncardiac chest pain  Signed: Marcina Millard MD,PhD, [facility] 05/25/2021, 10:12 AM

## 2021-05-25 NOTE — ED Notes (Signed)
Pt assisted with changing into gown

## 2021-05-26 DIAGNOSIS — R079 Chest pain, unspecified: Secondary | ICD-10-CM | POA: Diagnosis not present

## 2021-05-26 DIAGNOSIS — G20 Parkinson's disease: Secondary | ICD-10-CM

## 2021-05-26 DIAGNOSIS — I10 Essential (primary) hypertension: Secondary | ICD-10-CM | POA: Diagnosis not present

## 2021-05-26 DIAGNOSIS — F419 Anxiety disorder, unspecified: Secondary | ICD-10-CM

## 2021-05-26 DIAGNOSIS — E1169 Type 2 diabetes mellitus with other specified complication: Secondary | ICD-10-CM

## 2021-05-26 DIAGNOSIS — R0789 Other chest pain: Secondary | ICD-10-CM | POA: Diagnosis not present

## 2021-05-26 LAB — LIPID PANEL
Cholesterol: 142 mg/dL (ref 0–200)
HDL: 39 mg/dL — ABNORMAL LOW (ref >40)
LDL Cholesterol: 85 mg/dL (ref 0–99)
Total CHOL/HDL Ratio: 3.6 RATIO
Triglycerides: 90 mg/dL (ref <150)
VLDL: 18 mg/dL (ref 0–40)

## 2021-05-26 LAB — CBG MONITORING, ED: Glucose-Capillary: 151 mg/dL — ABNORMAL HIGH (ref 70–99)

## 2021-05-26 LAB — TROPONIN I (HIGH SENSITIVITY): Troponin I (High Sensitivity): 4 ng/L (ref <18)

## 2021-05-26 MED ORDER — ASPIRIN 81 MG PO TBEC
81.0000 mg | DELAYED_RELEASE_TABLET | Freq: Every day | ORAL | 11 refills | Status: AC
Start: 2021-05-26 — End: ?

## 2021-05-26 MED ORDER — DOCUSATE SODIUM 100 MG PO CAPS
100.0000 mg | ORAL_CAPSULE | Freq: Two times a day (BID) | ORAL | Status: DC
  Administered 2021-05-26: 100 mg via ORAL
  Filled 2021-05-26: qty 1

## 2021-05-26 MED ORDER — GABAPENTIN 100 MG PO CAPS: 100.0000 mg | ORAL_CAPSULE | Freq: Three times a day (TID) | ORAL | 0 refills | Status: DC

## 2021-05-26 NOTE — Care Management Obs Status (Signed)
MEDICARE OBSERVATION STATUS NOTIFICATION   Patient Details  Name: Jeremiah Taylor. MRN: [id number] Date of Birth: 08/05/1960   Medicare Observation Status Notification Given:  Yes    Allayne Butcher, RN 05/26/2021, 11:59 AM

## 2021-05-26 NOTE — Discharge Summary (Signed)
Physician Discharge Summary  Jeremiah Taylor. DGU:[id number] DOB: 02-22-1961 DOA: 05/25/2021  PCP: Enid Baas, MD  Admit date: 05/25/2021 Discharge date: 05/26/2021  Admitted From: Home  Discharge disposition: Home.  Recommendations for Outpatient Follow-Up:   Follow up with your primary care provider in one week.  Check CBC, BMP, magnesium in the next visit Consider neurological evaluation for left arm pain.  Possibility of neuropathy.  Had carpal tunnel syndrome on the right side.  Has been started on low-dose Neurontin for now.  Reassess in the next visit.   Discharge Diagnosis:   Principal Problem:   Atypical chest pain Active Problems:   HTN (hypertension)   Parkinson's disease (HCC)   Type 2 diabetes mellitus with other specified complication (HCC)   Chest pain   CAD (coronary artery disease)   Anxiety    Discharge Condition: Improved.  Diet recommendation:  Carbohydrate-modified   Wound care: None.  Code status: Full.   History of Present Illness:   Jeremiah Taylor. is a 60 y.o. male with medical history of Parkinson's disease, hypertension, dyslipidemia, borderline diabetes presented to hospital with left arm pain 2 days prior to presentation which was intermittent and sharp in nature with mild chest discomfort but no shortness of breath or palpitation.  Of note patient did have recent bradycardic catheterization on 08/06/2020 with diffuse mild to moderate CAD.  Initial labs were within normal limits.  Troponin negative.  EKG was unremarkable.  Chest x-ray without any acute findings.  CT head scan was notable for for some mastoid effusion.  Patient was then placed in observation during hospitalization.   Hospital Course:   Following conditions were addressed during hospitalization as listed below,  Atypical chest pain. More pain over the arm and forearm.  Rule out neuropathy.  We will give a trial of gabapentin on discharge.  History of coronary  artery disease but cardiac cath on 07/24/2020 with known significant CAD.  Troponins negative.  EKG was unremarkable.  Patient does not complain further pain.  Patient will continue high intensity statins, beta-blockers and aspirin at home.  Patient was seen by cardiology during hospitalization.  No further work-up planned.  Acute coronary syndrome has been ruled out.  Spoke with cardiology prior to disposition.  Hypertension essential.  Continue carvedilol, benazepril and amlodipine     Asthma with mild exacerbation Continue steroid inhaler including bronchodilators from home.  No active wheezing at this time    Depression/anxiety Continue Lexapro and Klonopin.     Parkinson's disease Continue amantadine and Sinemet     Type 2 diabetes mellitus Diet controlled.  Disposition.  At this time, patient is stable for disposition home with outpatient PCP follow-up.  Medical Consultants:   Cardiology  Procedures:    None Subjective:   Today, patient was seen and examined at bedside.  Complains of mild pain over  forearm but no chest pain.  Feels like sharp pain.  History of carpal tunnel syndrome on the right side  Discharge Exam:   Vitals:   05/26/21 0630 05/26/21 0937  BP: (!) 160/88 (!) 158/99  Pulse: (!) 51 (!) 58  Resp: 12 18  Temp:    SpO2: 97% 100%   Vitals:   05/26/21 0530 05/26/21 0605 05/26/21 0630 05/26/21 0937  BP: (!) 152/96 (!) 163/88 (!) 160/88 (!) 158/99  Pulse: (!) 49 (!) 51 (!) 51 (!) 58  Resp: 11 16 12 18   Temp:      TempSrc:      SpO2:  95% 99% 97% 100%  Weight:      Height:       General: Alert awake, not in obvious distress HENT: pupils equally reacting to light,  No scleral pallor or icterus noted. Oral mucosa is moist.  Chest:  Clear breath sounds.  Diminished breath sounds bilaterally. No crackles or wheezes.  CVS: S1 &S2 heard. No murmur.  Regular rate and rhythm. Abdomen: Soft, nontender, nondistended.  Bowel sounds are heard.   Extremities:  No cyanosis, clubbing or edema.  Peripheral pulses are palpable.  Right wrist with carpal tunnel surgery. Psych: Alert, awake and oriented, normal mood CNS:  No cranial nerve deficits.  Power equal in all extremities.  Involuntary movements noted Skin: Warm and dry.  No rashes noted.  The results of significant diagnostics from this hospitalization (including imaging, microbiology, ancillary and laboratory) are listed below for reference.     Diagnostic Studies:   CT Head Wo Contrast  Result Date: 05/25/2021 CLINICAL DATA:  60 year old male with Parkinson's. New or increasing headache. EXAM: CT HEAD WITHOUT CONTRAST TECHNIQUE: Contiguous axial images were obtained from the base of the skull through the vertex without intravenous contrast. COMPARISON:  Head CT 12/10/2020. FINDINGS: Brain: Cerebral volume remains normal for age. No midline shift, ventriculomegaly, mass effect, evidence of mass lesion, intracranial hemorrhage or evidence of cortically based acute infarction. Gray-white matter differentiation is within normal limits throughout the brain. Vascular: Calcified atherosclerosis at the skull base. No suspicious intracranial vascular hyperdensity. Skull: No acute osseous abnormality identified. Sinuses/Orbits: Increased mild right mastoid effusion. Right tympanic cavity also partially opacified now. Nasopharynx not included. Other Visualized paranasal sinuses and mastoids are stable and well aerated. Other: No acute orbit or scalp soft tissue finding. IMPRESSION: 1. Stable and normal for age non contrast CT appearance of the brain. 2. New partial opacification of the right tympanic cavity, and increased right mastoid effusion since June. Consider otitis media and/or right side eustachian tube dysfunction. Electronically Signed   By: Odessa Fleming M.D.   On: 05/25/2021 06:29   DG Chest Port 1 View  Result Date: 05/25/2021 CLINICAL DATA:  Chest pain EXAM: PORTABLE CHEST 1 VIEW COMPARISON:  01/20/2021  FINDINGS: Lungs are clear.  No pleural effusion or pneumothorax. The heart is normal in size. IMPRESSION: No evidence of acute cardiopulmonary disease. Electronically Signed   By: Charline Bills M.D.   On: 05/25/2021 00:05     Labs:   Basic Metabolic Panel: Recent Labs  Lab 05/24/21 2325 05/25/21 0737  NA 135  --   K 3.9  --   CL 101  --   CO2 25  --   GLUCOSE 80  --   BUN 19  --   CREATININE 0.99 0.85  CALCIUM 9.4  --    GFR Estimated Creatinine Clearance: 96 mL/min (by C-G formula based on SCr of 0.85 mg/dL). Liver Function Tests: No results for input(s): AST, ALT, ALKPHOS, BILITOT, PROT, ALBUMIN in the last 168 hours. No results for input(s): LIPASE, AMYLASE in the last 168 hours. No results for input(s): AMMONIA in the last 168 hours. Coagulation profile No results for input(s): INR, PROTIME in the last 168 hours.  CBC: Recent Labs  Lab 05/24/21 2325 05/25/21 0737  WBC 10.4 10.2  HGB 15.1 15.5  HCT 43.6 45.5  MCV 95.4 96.2  PLT 295 287   Cardiac Enzymes: Recent Labs  Lab 05/25/21 0148  CKTOTAL 214   BNP: Invalid input(s): POCBNP CBG: Recent Labs  Lab 05/25/21 1236  05/25/21 2008 05/26/21 0024  GLUCAP 179* 179* 151*   D-Dimer No results for input(s): DDIMER in the last 72 hours. Hgb A1c No results for input(s): HGBA1C in the last 72 hours. Lipid Profile Recent Labs    05/26/21 0623  CHOL 142  HDL 39*  LDLCALC 85  TRIG 90  CHOLHDL 3.6   Thyroid function studies No results for input(s): TSH, T4TOTAL, T3FREE, THYROIDAB in the last 72 hours.  Invalid input(s): FREET3 Anemia work up No results for input(s): VITAMINB12, FOLATE, FERRITIN, TIBC, IRON, RETICCTPCT in the last 72 hours. Microbiology Recent Results (from the past 240 hour(s))  Resp Panel by RT-PCR (Flu A&B, Covid) Nasopharyngeal Swab     Status: None   Collection Time: 05/25/21  8:50 AM   Specimen: Nasopharyngeal Swab; Nasopharyngeal(NP) swabs in vial transport medium  Result  Value Ref Range Status   SARS Coronavirus 2 by RT PCR NEGATIVE NEGATIVE Final    Comment: (NOTE) SARS-CoV-2 target nucleic acids are NOT DETECTED.  The SARS-CoV-2 RNA is generally detectable in upper respiratory specimens during the acute phase of infection. The lowest concentration of SARS-CoV-2 viral copies this assay can detect is 138 copies/mL. A negative result does not preclude SARS-Cov-2 infection and should not be used as the sole basis for treatment or other patient management decisions. A negative result may occur with  improper specimen collection/handling, submission of specimen other than nasopharyngeal swab, presence of viral mutation(s) within the areas targeted by this assay, and inadequate number of viral copies(<138 copies/mL). A negative result must be combined with clinical observations, patient history, and epidemiological information. The expected result is Negative.  Fact Sheet for Patients:  BloggerCourse.com  Fact Sheet for Healthcare Providers:  SeriousBroker.it  This test is no t yet approved or cleared by the [place] FDA and  has been authorized for detection and/or diagnosis of SARS-CoV-2 by FDA under an Emergency Use Authorization (EUA). This EUA will remain  in effect (meaning this test can be used) for the duration of the COVID-19 declaration under Section 564(b)(1) of the Act, 21 U.S.C.section 360bbb-3(b)(1), unless the authorization is terminated  or revoked sooner.       Influenza A by PCR NEGATIVE NEGATIVE Final   Influenza B by PCR NEGATIVE NEGATIVE Final    Comment: (NOTE) The Xpert Xpress SARS-CoV-2/FLU/RSV plus assay is intended as an aid in the diagnosis of influenza from Nasopharyngeal swab specimens and should not be used as a sole basis for treatment. Nasal washings and aspirates are unacceptable for Xpert Xpress SARS-CoV-2/FLU/RSV testing.  Fact Sheet for  Patients: BloggerCourse.com  Fact Sheet for Healthcare Providers: SeriousBroker.it  This test is not yet approved or cleared by the [place] FDA and has been authorized for detection and/or diagnosis of SARS-CoV-2 by FDA under an Emergency Use Authorization (EUA). This EUA will remain in effect (meaning this test can be used) for the duration of the COVID-19 declaration under Section 564(b)(1) of the Act, 21 U.S.C. section 360bbb-3(b)(1), unless the authorization is terminated or revoked.  Performed at [facility], [address] [place], [place] [address]      Discharge Instructions:   Discharge Instructions     Diet - low sodium heart healthy   Complete by: As directed    Discharge instructions   Complete by: As directed    Follow-up with your primary care physician in 1 week.  You have been started on gabapentin for pain over the arms.  This will need to be assessed  by your primary care physician.  Avoid overexertion.   Increase activity slowly   Complete by: As directed       Allergies as of 05/26/2021   No Known Allergies      Medication List     STOP taking these medications    cetirizine 10 MG tablet Commonly known as: ZYRTEC   HYDROcodone-acetaminophen 5-325 MG tablet Commonly known as: Norco   ipratropium 17 MCG/ACT inhaler Commonly known as: ATROVENT HFA   ipratropium-albuterol 0.5-2.5 (3) MG/3ML Soln Commonly known as: DUONEB   predniSONE 10 MG (21) Tbpk tablet Commonly known as: STERAPRED UNI-PAK 21 TAB       TAKE these medications    albuterol 108 (90 Base) MCG/ACT inhaler Commonly known as: VENTOLIN HFA Inhale 1-2 puffs into the lungs every 6 (six) hours as needed for wheezing or shortness of breath.   Amantadine HCl 100 MG tablet Take 100 mg by mouth daily.   amLODipine-benazepril 10-40 MG capsule Commonly known as: LOTREL Take 1 capsule by mouth daily.   aspirin  81 MG EC tablet Take 1 tablet (81 mg total) by mouth daily. Swallow whole.   atorvastatin 80 MG tablet Commonly known as: LIPITOR Take 80 mg by mouth at bedtime. What changed: Another medication with the same name was removed. Continue taking this medication, and follow the directions you see here.   budesonide 0.5 MG/2ML nebulizer solution Commonly known as: PULMICORT Take 0.5 mg by nebulization in the morning and at bedtime.   carbidopa-levodopa 25-100 MG tablet Commonly known as: SINEMET IR Take 3 tablets by mouth 4 (four) times daily. What changed: Another medication with the same name was removed. Continue taking this medication, and follow the directions you see here.   carvedilol 25 MG tablet Commonly known as: COREG Take 25 mg by mouth 2 (two) times daily with a meal.   carvedilol 12.5 MG tablet Commonly known as: COREG Take 12.5 mg by mouth 2 (two) times daily.   cholecalciferol 25 MCG (1000 UNIT) tablet Commonly known as: VITAMIN D3 Take 1,000 Units by mouth daily.   clonazePAM 0.5 MG tablet Commonly known as: KLONOPIN Take 0.5 mg by mouth at bedtime.   escitalopram 10 MG tablet Commonly known as: LEXAPRO Take 10 mg by mouth daily.   fluticasone 50 MCG/ACT nasal spray Commonly known as: FLONASE Place 1 spray into the nose 2 (two) times daily as needed for allergies.   gabapentin 100 MG capsule Commonly known as: Neurontin Take 1 capsule (100 mg total) by mouth 3 (three) times daily.   ibuprofen 600 MG tablet Commonly known as: ADVIL Take 600 mg by mouth every 8 (eight) hours as needed.   multivitamin with minerals Tabs tablet Take 1 tablet by mouth daily.   Symbicort 160-4.5 MCG/ACT inhaler Generic drug: budesonide-formoterol Inhale 2 puffs into the lungs 2 (two) times daily.        Follow-up Information     Enid Baas, MD Follow up in 1 week(s).   Specialty: Internal Medicine Contact information: [address] [place]  [address] [phone]                  Time coordinating discharge: 39 minutes  Signed:  Eniyah Eastmond  [facility] 05/26/2021, 11:16 AM

## 2021-05-26 NOTE — ED Notes (Signed)
Lab at the bedside 

## 2021-05-26 NOTE — TOC Initial Note (Signed)
Transition of Care (TOC) - Initial/Assessment Note    Patient Details  Name: Jeremiah Taylor. MRN: [id number] Date of Birth: 30-Jan-1961  Transition of Care [facility] CM/SW Contact:    Allayne Butcher, RN Phone Number: 05/26/2021, 12:16 PM  Clinical Narrative:                 Patient placed under observation for chest pain.  Patient is medically stable for discharge home today.  Patient is from home with his wife and daughter.  Wife is currently at work but daughter is home.  Daughter cannot drive and wife is on the other side of [place] so patient requests ride home.  RNCM can arrange [facility].  Patient's wife updated on discharge today.    Expected Discharge Plan: Home/Self Care Barriers to Discharge: No Barriers Identified   Patient Goals and CMS Choice Patient states their goals for this hospitalization and ongoing recovery are:: Patient glad to be going home      Expected Discharge Plan and Services Expected Discharge Plan: Home/Self Care       Living arrangements for the past 2 months: Single Family Home Expected Discharge Date: 05/26/21               DME Arranged: N/A DME Agency: NA       HH Arranged: NA          Prior Living Arrangements/Services Living arrangements for the past 2 months: Single Family Home Lives with:: Spouse Patient language and need for interpreter reviewed:: Yes Do you feel safe going back to the place where you live?: Yes      Need for Family Participation in Patient Care: Yes (Comment) (Parkinson's) Care giver support system in place?: Yes (comment) (wife)   Criminal Activity/Legal Involvement Pertinent to Current Situation/Hospitalization: No - Comment as needed  Activities of Daily Living      Permission Sought/Granted Permission sought to share information with : Family Supports, Case Manager Permission granted to share information with : Yes, Verbal Permission Granted  Share Information with NAME: Jeremiah Taylor      Permission granted to share info w Relationship: spouse  Permission granted to share info w Contact Information: [phone]  Emotional Assessment Appearance:: Appears stated age Attitude/Demeanor/Rapport: Engaged Affect (typically observed): Accepting Orientation: : Oriented to Self, Oriented to Place, Oriented to  Time, Oriented to Situation Alcohol / Substance Use: Not Applicable Psych Involvement: No (comment)  Admission diagnosis:  Chest pain [R07.9] Patient Active Problem List   Diagnosis Date Noted   Chest pain 05/25/2021   Atypical chest pain 05/25/2021   CAD (coronary artery disease) 05/25/2021   Anxiety    COPD with acute exacerbation (HCC) 01/21/2021   Acute respiratory failure with hypoxia (HCC) 01/21/2021   COPD exacerbation (HCC) 01/21/2021   Parkinson's disease (HCC) 02/17/2020   Type 2 diabetes mellitus with other specified complication (HCC) 07/20/2019   HTN (hypertension) 10/04/2006   PCP:  Enid Baas, MD Pharmacy:   [facility] [address] [place] - [address] [place] [address] Phone: [phone] Fax: [phone]     Social Determinants of Health (SDOH) Interventions    Readmission Risk Interventions No flowsheet data found.

## 2021-07-29 ENCOUNTER — Encounter: Payer: Self-pay | Admitting: Student in an Organized Health Care Education/Training Program

## 2021-07-29 ENCOUNTER — Other Ambulatory Visit: Payer: Self-pay

## 2021-07-29 ENCOUNTER — Ambulatory Visit
Payer: Medicare HMO | Attending: Student in an Organized Health Care Education/Training Program | Admitting: Student in an Organized Health Care Education/Training Program

## 2021-07-29 VITALS — BP 160/89 | HR 77 | Temp 97.2°F | Resp 16 | Ht 71.0 in | Wt 186.0 lb

## 2021-07-29 DIAGNOSIS — G894 Chronic pain syndrome: Secondary | ICD-10-CM | POA: Diagnosis present

## 2021-07-29 DIAGNOSIS — M5412 Radiculopathy, cervical region: Secondary | ICD-10-CM | POA: Insufficient documentation

## 2021-07-29 DIAGNOSIS — F1410 Cocaine abuse, uncomplicated: Secondary | ICD-10-CM | POA: Diagnosis present

## 2021-07-29 DIAGNOSIS — M5416 Radiculopathy, lumbar region: Secondary | ICD-10-CM | POA: Insufficient documentation

## 2021-07-29 NOTE — Progress Notes (Signed)
Patient: Jeremiah Taylor.  Service Category: E/M  Provider: Gillis Santa, MD  DOB: 01-21-1961  DOS: 07/29/2021  Referring Provider: Doyle Askew, MD  MRN: [id number]  Setting: Ambulatory outpatient  PCP: Jeremiah Lighter, MD  Type: New Patient  Specialty: Interventional Pain Management    Location: Office  Delivery: Face-to-face     Primary Reason(s) for Visit: Encounter for initial evaluation of one or more chronic problems (new to examiner) potentially causing chronic pain, and posing a threat to normal musculoskeletal function. (Level of risk: High) CC: Neck Pain (Radiates down left arm to forearm) and Back Pain (Radiates down right down the side to mid calf)  HPI  Jeremiah Taylor is a 61 y.o. year old, male patient, who comes for the first time to our practice referred by Jeremiah Hake I, MD for our initial evaluation of his chronic pain. He has COPD with acute exacerbation ([place]); HTN (hypertension); Parkinson's disease ([place]); Type 2 diabetes mellitus with other specified complication ([place]); Acute respiratory failure with hypoxia ([place]); COPD exacerbation ([place]); Chest pain; Atypical chest pain; CAD (coronary artery disease); and Anxiety on their problem list. Today he comes in for evaluation of his Neck Pain (Radiates down left arm to forearm) and Back Pain (Radiates down right down the side to mid calf)  Pain Assessment: Location: Left Neck Radiating: radiates down left arm Onset: More than a month ago Duration: Chronic pain Quality: Sharp Severity: 6 /10 (subjective, self-reported pain score)  Effect on ADL: Limits activities Timing: Intermittent Modifying factors: lying on side BP: (!) 160/89   HR: 77  Onset and Duration: Gradual and Present longer than 3 months Cause of pain: Unknown Severity: Getting worse, NAS-11 at its worse: 10/10, NAS-11 at its best: 5/10, NAS-11 now: 6/10, and NAS-11 on the average: 8/10 Timing: Night Aggravating Factors:  lying on back Alleviating  Factors:  lying on either side Associated Problems: Tingling Quality of Pain: Sharp Previous Examinations or Tests: CT scan Previous Treatments: Epidural steroid injections  Jeremiah Taylor is a 61 year old male with a history of Parkinson's who presents with a chief complaint of neck pain that radiates into his left arm related to bilateral neuroforaminal narrowing at C5-C6 as well as moderate central canal stenosis at C6-C7.  He was previously evaluated by [facility] for a cervical epidural steroid injection.  Because of his Parkinson's, patient needs to have sedation to tolerate cervical epidural steroid injection.  Taylor did discuss this with the patient.  Of note patient has a prior urine toxicology screen 8 days ago that was positive for cocaine.  Taylor informed the patient that Taylor will not be performing any interventions on an individual that is utilizing illicit substances.  Patient states that it was his first intake since 3 years ago and he has a difficult social situation and has been given resources to help.  Patient has a tremor in place and Taylor informed him that even with sedation he would need to remain stationary for [place] to safely do a cervical epidural steroid injection.  He is also complaining of low back pain that radiates into his left leg.  We discussed a lumbar epidural steroid injection as long as he is able to tolerate his cervical ESI fine.  Jeremiah Taylor's candidacy for injections is incumbent upon him having a clean urine toxicology screen.  Taylor was very clear with him that if it is positive for any illicit substances including cocaine that Taylor will not be performing any procedures for him.  Meds  Current Outpatient Medications:    albuterol (VENTOLIN HFA) 108 (90 Base) MCG/ACT inhaler, Inhale 1-2 puffs into the lungs every 6 (six) hours as needed for wheezing or shortness of breath., Disp: , Rfl:    Amantadine HCl 100 MG tablet, Take 100 mg by mouth daily., Disp: , Rfl:     amLODipine-benazepril (LOTREL) 10-40 MG capsule, Take 1 capsule by mouth daily., Disp: , Rfl:    aspirin EC 81 MG EC tablet, Take 1 tablet (81 mg total) by mouth daily. Swallow whole., Disp: 30 tablet, Rfl: 11   atorvastatin (LIPITOR) 80 MG tablet, Take 80 mg by mouth at bedtime., Disp: , Rfl:    budesonide (PULMICORT) 0.5 MG/2ML nebulizer solution, Take 0.5 mg by nebulization in the morning and at bedtime., Disp: , Rfl:    carbidopa-levodopa (SINEMET IR) 25-100 MG tablet, Take 3 tablets by mouth 4 (four) times daily., Disp: , Rfl:    carvedilol (COREG) 12.5 MG tablet, Take 12.5 mg by mouth 2 (two) times daily., Disp: , Rfl:    cholecalciferol (VITAMIN D3) 25 MCG (1000 UNIT) tablet, Take 1,000 Units by mouth daily., Disp: , Rfl:    clonazePAM (KLONOPIN) 0.5 MG tablet, Take 0.5 mg by mouth at bedtime., Disp: , Rfl:    escitalopram (LEXAPRO) 10 MG tablet, Take 10 mg by mouth daily., Disp: , Rfl:    fluticasone (FLONASE) 50 MCG/ACT nasal spray, Place 1 spray into the nose 2 (two) times daily as needed for allergies., Disp: , Rfl:    gabapentin (NEURONTIN) 100 MG capsule, Take 1 capsule (100 mg total) by mouth 3 (three) times daily., Disp: 90 capsule, Rfl: 0   ibuprofen (ADVIL) 600 MG tablet, Take 600 mg by mouth every 8 (eight) hours as needed., Disp: , Rfl:    Multiple Vitamin (MULTIVITAMIN WITH MINERALS) TABS tablet, Take 1 tablet by mouth daily., Disp: , Rfl:    SYMBICORT 160-4.5 MCG/ACT inhaler, Inhale 2 puffs into the lungs 2 (two) times daily., Disp: , Rfl:    carvedilol (COREG) 25 MG tablet, Take 25 mg by mouth 2 (two) times daily with a meal. (Patient not taking: Reported on 07/29/2021), Disp: , Rfl:   Imaging Review   Narrative CLINICAL DATA:  Blunt head injury, neck injury  EXAM: CT HEAD WITHOUT CONTRAST  CT CERVICAL SPINE WITHOUT CONTRAST  TECHNIQUE: Multidetector CT imaging of the head and cervical spine was performed following the standard protocol without intravenous contrast.  Multiplanar CT image reconstructions of the cervical spine were also generated.  COMPARISON:  None  FINDINGS: CT HEAD FINDINGS  Brain: Imaging is slightly limited by motion artifact. No abnormal intra or extra-axial mass lesion or fluid collection. No abnormal mass effect or midline shift. No evidence of acute intracranial hemorrhage or infarct. Ventricular size is normal. Cerebellum unremarkable.  Vascular: Unremarkable  Skull: Intact  Sinuses/Orbits: Paranasal sinuses are clear. Orbits are unremarkable.  Other: Mastoid air cells and middle ear cavities are clear. Small left frontotemporal scalp hematoma noted.  CT CERVICAL SPINE FINDINGS  Alignment: 2-3 mm retrolisthesis of C6 upon C7. Mild reversal of the normal cervical lordosis.  Skull base and vertebrae: Imaging is slightly limited by motion artifact. Craniocervical alignment is normal. Atlantodental interval is not widened. No acute fracture identified.  Soft tissues and spinal canal: Moderate central canal stenosis secondary to posterior disc osteophyte complex at C6-7 results in mild flattening of the thecal sac, not well assessed on this examination. No canal hematoma. The prevertebral soft tissues are not thickened. No  paraspinal fluid collection.  Disc levels: There is intervertebral disc space narrowing and endplate remodeling at X5-Q0 in keeping with changes of moderate degenerative disc disease. Milder degenerative changes are noted at C4-5. Vertebral body heights are preserved. The prevertebral soft tissues are not thickened on sagittal reformats. Review of the axial images demonstrates moderate uncovertebral arthrosis at C6-7 and C5-6 resulting in mild-to-moderate bilateral neuroforaminal narrowing at C5-6 remaining neural foramina are widely patent.  Upper chest: Unremarkable  Other: None  IMPRESSION: No acute intracranial injury. No calvarial fracture. Small left frontal scalp hematoma.  No  acute fracture or listhesis of the cervical spine.  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   Electronically Signed By: Fidela Salisbury MD On: 12/09/2020 04:13   Complexity Note: Imaging results reviewed. Results shared with Mr. Gaumer, using Layman's terms.                         ROS  Cardiovascular: Heart trouble, High blood pressure, and Blood thinners:  Anticoagulant Pulmonary or Respiratory: Difficulty blowing air out (Emphysema) Neurological: No reported neurological signs or symptoms such as seizures, abnormal skin sensations, urinary and/or fecal incontinence, being born with an abnormal open spine and/or a tethered spinal cord Psychological-Psychiatric: Anxiousness Gastrointestinal: No reported gastrointestinal signs or symptoms such as vomiting or evacuating blood, reflux, heartburn, alternating episodes of diarrhea and constipation, inflamed or scarred liver, or pancreas or irrregular and/or infrequent bowel movements Genitourinary: No reported renal or genitourinary signs or symptoms such as difficulty voiding or producing urine, peeing blood, non-functioning kidney, kidney stones, difficulty emptying the bladder, difficulty controlling the flow of urine, or chronic kidney disease Hematological: No reported hematological signs or symptoms such as prolonged bleeding, low or poor functioning platelets, bruising or bleeding easily, hereditary bleeding problems, low energy levels due to low hemoglobin or being anemic Endocrine: High blood sugar controlled without the use of insulin (NIDDM) and No reported endocrine signs or symptoms such as high or low blood sugar, rapid heart rate due to high thyroid levels, obesity or weight gain due to slow thyroid or thyroid disease Rheumatologic: No reported rheumatological signs and symptoms such as fatigue,  joint pain, tenderness, swelling, redness, heat, stiffness, decreased range of motion, with or without associated rash Musculoskeletal: Negative for myasthenia gravis, muscular dystrophy, multiple sclerosis or malignant hyperthermia Work History: Unemployed  Allergies  Mr. Wagley has No Known Allergies.  Laboratory Chemistry Profile   Renal Lab Results  Component Value Date   BUN 19 05/24/2021   CREATININE 0.85 05/25/2021   GFRNONAA >60 05/25/2021     Electrolytes Lab Results  Component Value Date   NA 135 05/24/2021   K 3.9 05/24/2021   CL 101 05/24/2021   CALCIUM 9.4 05/24/2021     Hepatic Lab Results  Component Value Date   AST 22 01/20/2021   ALT <5 01/20/2021   ALBUMIN 4.2 01/20/2021   ALKPHOS 52 01/20/2021     ID Lab Results  Component Value Date   HIV Non Reactive 01/21/2021   [place] NEGATIVE 05/25/2021     Bone No results found for: [place], [id number], [id number], [id number], 25OHVITD1, 25OHVITD2, 25OHVITD3, TESTOFREE, TESTOSTERONE   Endocrine Lab Results  Component Value Date   GLUCOSE 80 05/24/2021     Neuropathy Lab Results  Component Value Date   HIV Non Reactive 01/21/2021     CNS No results found  for: COLORCSF, APPEARCSF, RBCCOUNTCSF, WBCCSF, POLYSCSF, LYMPHSCSF, EOSCSF, PROTEINCSF, GLUCCSF, JCVIRUS, CSFOLI, IGGCSF, LABACHR, ACETBL, LABACHR, ACETBL   Inflammation (CRP: Acute   ESR: Chronic) No results found for: CRP, ESRSEDRATE, LATICACIDVEN   Rheumatology No results found for: RF, ANA, LABURIC, URICUR, LYMEIGGIGMAB, LYMEABIGMQN, HLAB27   Coagulation Lab Results  Component Value Date   PLT 287 05/25/2021     Cardiovascular Lab Results  Component Value Date   BNP 8.1 01/20/2021   CKTOTAL 214 05/25/2021   HGB 15.5 05/25/2021   HCT 45.5 05/25/2021     Screening Lab Results  Component Value Date   SARSCOV2NAA NEGATIVE 05/25/2021   HIV Non Reactive 01/21/2021     Cancer No results found for: CEA, CA125, LABCA2    Allergens No results found for: ALMOND, APPLE, ASPARAGUS, AVOCADO, BANANA, BARLEY, BASIL, B[place], GREENBEAN, LIMABEAN, WHITEBEAN, BEEFIGE, REDBEET, BLUEBERRY, BROCCOLI, CABBAGE, MELON, CARROT, CASEIN, CASHEWNUT, CAULIFLOWER, CELERY     Note: Lab results reviewed.  PFSH  Drug: Mr. Vanbeek  reports that he does not currently use drugs. Alcohol:  reports that he does not currently use alcohol. Tobacco:  reports that he has been smoking cigarettes. He has never used smokeless tobacco. Medical:  has a past medical history of Anxiety, Asthma, Hypertension, and Parkinson disease ([place]). Family: family history includes Hypertension in his mother.  Past Surgical History:  Procedure Laterality Date   CARPAL TUNNEL RELEASE Right    x3   CHOLECYSTECTOMY     OLECRANON BURSECTOMY Right 04/29/2021   Procedure: Right olecranon bursa excision;  Surgeon: Hessie Knows, MD;  Location: [facility] ORS;  Service: Orthopedics;  Laterality: Right;   REPLACEMENT TOTAL KNEE Right    Active Ambulatory Problems    Diagnosis Date Noted   COPD with acute exacerbation ([place]) 01/21/2021   HTN (hypertension) 10/04/2006   Parkinson's disease ([place]) 02/17/2020   Type 2 diabetes mellitus with other specified complication ([place]) 76/19/5093   Acute respiratory failure with hypoxia ([place]) 01/21/2021   COPD exacerbation ([place]) 01/21/2021   Chest pain 05/25/2021   Atypical chest pain 05/25/2021   CAD (coronary artery disease) 05/25/2021   Anxiety    Resolved Ambulatory Problems    Diagnosis Date Noted   No Resolved Ambulatory Problems   Past Medical History:  Diagnosis Date   Asthma    Hypertension    Parkinson disease ([place])    Constitutional Exam  General appearance: Well nourished, well developed, and well hydrated. In no apparent acute distress Vitals:   07/29/21 1126 07/29/21 1128  BP:  (!) 160/89  Pulse: 77   Resp: 16   Temp:  (!) 97.2 F (36.2 C)  SpO2: 99%   Weight: 186 lb (84.4 kg)   Height: _0  (1.803  m)    BMI Assessment: Estimated body mass index is 25.94 kg/m as calculated from the following:   Height as of this encounter: _1  (1.803 m).   Weight as of this encounter: 186 lb (84.4 kg).  BMI interpretation table: BMI level Category Range association with higher incidence of chronic pain  <18 kg/m2 Underweight   18.5-24.9 kg/m2 Ideal body weight   25-29.9 kg/m2 Overweight Increased incidence by 20%  30-34.9 kg/m2 Obese (Class [facility]) Increased incidence by 68%  35-39.9 kg/m2 Severe obesity (Class II) Increased incidence by 136%  >40 kg/m2 Extreme obesity (Class III) Increased incidence by 254%   Patient's current BMI Ideal Body weight  Body mass index is 25.94 kg/m. Ideal body weight: 75.3 kg (166 lb 0.1 oz) Adjusted ideal  body weight: 78.9 kg (174 lb 0.1 oz)   BMI Readings from Last 4 Encounters:  07/29/21 25.94 kg/m  05/24/21 26.50 kg/m  04/29/21 25.94 kg/m  04/25/21 25.94 kg/m   Wt Readings from Last 4 Encounters:  07/29/21 186 lb (84.4 kg)  05/24/21 190 lb (86.2 kg)  04/29/21 186 lb (84.4 kg)  04/25/21 186 lb (84.4 kg)    Psych/Mental status: Alert, oriented x 3 (person, place, & time)       Eyes: PERLA Respiratory: No evidence of acute respiratory distress  Cervical Spine Area Exam  Skin & Axial Inspection: No masses, redness, edema, swelling, or associated skin lesions Alignment: Asymmetric Functional ROM: Pain restricted ROM      Stability: No instability detected Muscle Tone/Strength: Guarding observed Sensory (Neurological): Dermatomal pain pattern left C6-C7 Palpation: No palpable anomalies             Upper Extremity (UE) Exam    Side: Right upper extremity  Side: Left upper extremity  Skin & Extremity Inspection: Skin color, temperature, and hair growth are WNL. No peripheral edema or cyanosis. No masses, redness, swelling, asymmetry, or associated skin lesions. No contractures.  Skin & Extremity Inspection: Skin color, temperature, and hair growth  are WNL. No peripheral edema or cyanosis. No masses, redness, swelling, asymmetry, or associated skin lesions. No contractures.  Functional ROM: Unrestricted ROM          Functional ROM: Pain restricted ROM        cogwheel rigidity  Muscle Tone/Strength: Functionally intact. No obvious neuro-muscular anomalies detected.  Muscle Tone/Strength: Functionally intact. No obvious neuro-muscular anomalies detected.  Sensory (Neurological): Unimpaired          Sensory (Neurological): Dermatomal pain pattern          Palpation: No palpable anomalies              Palpation: No palpable anomalies              Provocative Test(s):  Phalen's test: deferred Tinel's test: deferred Apley's scratch test (touch opposite shoulder):  Action 1 (Across chest): deferred Action 2 (Overhead): deferred Action 3 (LB reach): deferred   Provocative Test(s):  Phalen's test: deferred Tinel's test: deferred Apley's scratch test (touch opposite shoulder):  Action 1 (Across chest): Decreased ROM Action 2 (Overhead): Decreased ROM Action 3 (LB reach): Decreased ROM    Lumbar Spine Area Exam  Skin & Axial Inspection: No masses, redness, or swelling Alignment: Symmetrical Functional ROM: Pain restricted ROM       Stability: No instability detected Muscle Tone/Strength: Functionally intact. No obvious neuro-muscular anomalies detected. Sensory (Neurological): Dermatomal pain pattern left L4-L5  Gait & Posture Assessment  Ambulation: Unassisted Gait: Relatively normal for age and body habitus Posture: WNL  Lower Extremity Exam    Side: Right lower extremity  Side: Left lower extremity  Stability: No instability observed          Stability: No instability observed          Skin & Extremity Inspection: Skin color, temperature, and hair growth are WNL. No peripheral edema or cyanosis. No masses, redness, swelling, asymmetry, or associated skin lesions. No contractures.  Skin & Extremity Inspection: Skin color,  temperature, and hair growth are WNL. No peripheral edema or cyanosis. No masses, redness, swelling, asymmetry, or associated skin lesions. No contractures.  Functional ROM: Unrestricted ROM                  Functional ROM: Pain restricted ROM for  hip and knee joints positive straight leg raise test          Muscle Tone/Strength: Functionally intact. No obvious neuro-muscular anomalies detected.  Muscle Tone/Strength: Functionally intact. No obvious neuro-muscular anomalies detected.  Sensory (Neurological): Unimpaired        Sensory (Neurological): Dermatomal pain pattern        DTR: Patellar: deferred today Achilles: deferred today Plantar: deferred today  DTR: Patellar: deferred today Achilles: deferred today Plantar: deferred today  Palpation: No palpable anomalies  Palpation: No palpable anomalies    Assessment  Primary Diagnosis & Pertinent Problem List: The primary encounter diagnosis was Cervical radicular pain. Diagnoses of Cocaine abuse ([place]), Lumbar radicular pain (left L4/5), and Chronic pain syndrome were also pertinent to this visit.  Visit Diagnosis (New problems to examiner): 1. Cervical radicular pain   2. Cocaine abuse ([place])   3. Lumbar radicular pain (left L4/5)   4. Chronic pain syndrome    Plan of Care (Initial workup plan)    Lab Orders         Compliance Drug Analysis, Ur     1. Cervical radicular pain -Degenerative disc and degenerative joint disease at C5-C7 resulting in moderate central canal stenosis at C6-7 and mild-to-moderate bilateral neuroforaminal narrowing at C5-6. -Referred from Dr. Alba Destine for cervical epidural steroid injection under sedation as patient has Parkinson's  2. Cocaine abuse ([facility]) -Urine toxicology screen that was done approximately 6 days ago was positive for cocaine. -Repeat today, so long as it is clean and appropriate can schedule patient for cervical epidural steroid injection.  If it is inappropriate, would not plan for any  injection  3. Lumbar radicular pain (left L4/5) -Consider left L4-L5 ESI under sedation  4. Chronic pain syndrome - Compliance Drug Analysis, Ur   Provider-requested follow-up: Return for pending clean UDS- will wait to order C-ESI with sedation.  Taylor spent a total of 60 minutes reviewing chart data, face-to-face evaluation with the patient, counseling and coordination of care as detailed above.   No future appointments.  Note by: Jeremiah Santa, MD Date: 07/29/2021; Time: 2:44 PM

## 2021-08-03 LAB — COMPLIANCE DRUG ANALYSIS, UR

## 2021-08-04 ENCOUNTER — Ambulatory Visit: Payer: Medicare HMO

## 2021-08-04 ENCOUNTER — Encounter: Payer: Medicare HMO | Admitting: Speech Pathology

## 2021-08-10 ENCOUNTER — Other Ambulatory Visit: Payer: Self-pay

## 2021-08-10 ENCOUNTER — Emergency Department
Admission: EM | Admit: 2021-08-10 | Discharge: 2021-08-10 | Disposition: A | Discharge status: Home / Self Care | Payer: Medicare HMO | Attending: Emergency Medicine | Admitting: Emergency Medicine

## 2021-08-10 DIAGNOSIS — Z20822 Contact with and (suspected) exposure to covid-19: Secondary | ICD-10-CM | POA: Diagnosis not present

## 2021-08-10 DIAGNOSIS — J40 Bronchitis, not specified as acute or chronic: Secondary | ICD-10-CM

## 2021-08-10 DIAGNOSIS — E119 Type 2 diabetes mellitus without complications: Secondary | ICD-10-CM | POA: Diagnosis not present

## 2021-08-10 DIAGNOSIS — L03011 Cellulitis of right finger: Secondary | ICD-10-CM

## 2021-08-10 DIAGNOSIS — G20 Parkinson's disease: Secondary | ICD-10-CM | POA: Diagnosis not present

## 2021-08-10 DIAGNOSIS — I10 Essential (primary) hypertension: Secondary | ICD-10-CM | POA: Insufficient documentation

## 2021-08-10 DIAGNOSIS — M79644 Pain in right finger(s): Secondary | ICD-10-CM | POA: Diagnosis present

## 2021-08-10 DIAGNOSIS — Z760 Encounter for issue of repeat prescription: Secondary | ICD-10-CM | POA: Diagnosis not present

## 2021-08-10 LAB — RESP PANEL BY RT-PCR (FLU A&B, COVID) ARPGX2
Influenza A by PCR: NEGATIVE
Influenza B by PCR: NEGATIVE
SARS Coronavirus 2 by RT PCR: NEGATIVE

## 2021-08-10 MED ORDER — ACETAMINOPHEN 500 MG PO TABS
1000.0000 mg | ORAL_TABLET | Freq: Once | ORAL | Status: AC
  Administered 2021-08-10: 1000 mg via ORAL
  Filled 2021-08-10: qty 2

## 2021-08-10 MED ORDER — DOXYCYCLINE HYCLATE 100 MG PO CAPS: 100.0000 mg | ORAL_CAPSULE | Freq: Two times a day (BID) | ORAL | 0 refills | Status: AC

## 2021-08-10 MED ORDER — AMANTADINE HCL 100 MG PO TABS: 100.0000 mg | ORAL_TABLET | Freq: Every day | ORAL | 0 refills | Status: DC

## 2021-08-10 MED ORDER — AMANTADINE HCL 100 MG PO CAPS
100.0000 mg | ORAL_CAPSULE | Freq: Every day | ORAL | Status: DC
  Administered 2021-08-10: 100 mg via ORAL
  Filled 2021-08-10: qty 1

## 2021-08-10 MED ORDER — LIDOCAINE HCL (PF) 1 % IJ SOLN
10.0000 mL | Freq: Once | INTRAMUSCULAR | Status: AC
  Administered 2021-08-10: 10 mL via INTRADERMAL
  Filled 2021-08-10: qty 10

## 2021-08-10 MED ORDER — IBUPROFEN 400 MG PO TABS
400.0000 mg | ORAL_TABLET | Freq: Once | ORAL | Status: AC
  Administered 2021-08-10: 400 mg via ORAL
  Filled 2021-08-10: qty 1

## 2021-08-10 MED ORDER — GABAPENTIN 100 MG PO CAPS: 100.0000 mg | ORAL_CAPSULE | Freq: Three times a day (TID) | ORAL | 0 refills | Status: DC
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
## 2021-08-10 NOTE — ED Triage Notes (Signed)
Pt states that he pulled a hang nail on his R pinky finger and now his finger is infected and his whole hand hurts- pt has a noticeable pus pocket on that finger with redness and swelling- pt states he also may need a breathing treatment d/t not having his inhaler

## 2021-08-18 ENCOUNTER — Ambulatory Visit: Payer: Medicare HMO

## 2021-08-20 ENCOUNTER — Ambulatory Visit: Payer: Medicare HMO

## 2021-08-25 ENCOUNTER — Ambulatory Visit: Payer: Medicare HMO

## 2021-08-27 ENCOUNTER — Ambulatory Visit: Payer: Medicare HMO

## 2021-09-01 ENCOUNTER — Ambulatory Visit: Payer: Medicare HMO

## 2021-09-03 ENCOUNTER — Ambulatory Visit: Payer: Medicare HMO

## 2021-09-08 ENCOUNTER — Ambulatory Visit: Payer: Medicare HMO

## 2021-09-10 ENCOUNTER — Ambulatory Visit: Payer: Medicare HMO

## 2021-09-14 ENCOUNTER — Other Ambulatory Visit: Payer: Self-pay

## 2021-09-14 ENCOUNTER — Encounter: Payer: Self-pay | Admitting: Intensive Care

## 2021-09-14 ENCOUNTER — Emergency Department
Admission: EM | Admit: 2021-09-14 | Discharge: 2021-09-16 | Disposition: A | Discharge status: Other Acute Inpatient Hospital | Payer: Medicare HMO | Attending: Emergency Medicine | Admitting: Emergency Medicine

## 2021-09-14 ENCOUNTER — Emergency Department: Payer: Medicare HMO

## 2021-09-14 DIAGNOSIS — Z20822 Contact with and (suspected) exposure to covid-19: Secondary | ICD-10-CM | POA: Insufficient documentation

## 2021-09-14 DIAGNOSIS — I2510 Atherosclerotic heart disease of native coronary artery without angina pectoris: Secondary | ICD-10-CM | POA: Diagnosis not present

## 2021-09-14 DIAGNOSIS — R4689 Other symptoms and signs involving appearance and behavior: Secondary | ICD-10-CM | POA: Diagnosis not present

## 2021-09-14 DIAGNOSIS — F319 Bipolar disorder, unspecified: Secondary | ICD-10-CM | POA: Insufficient documentation

## 2021-09-14 DIAGNOSIS — R455 Hostility: Secondary | ICD-10-CM | POA: Diagnosis present

## 2021-09-14 DIAGNOSIS — R079 Chest pain, unspecified: Secondary | ICD-10-CM | POA: Insufficient documentation

## 2021-09-14 DIAGNOSIS — J441 Chronic obstructive pulmonary disease with (acute) exacerbation: Secondary | ICD-10-CM | POA: Insufficient documentation

## 2021-09-14 DIAGNOSIS — J45909 Unspecified asthma, uncomplicated: Secondary | ICD-10-CM | POA: Diagnosis not present

## 2021-09-14 DIAGNOSIS — R531 Weakness: Secondary | ICD-10-CM | POA: Diagnosis not present

## 2021-09-14 DIAGNOSIS — R456 Violent behavior: Secondary | ICD-10-CM | POA: Diagnosis not present

## 2021-09-14 DIAGNOSIS — E119 Type 2 diabetes mellitus without complications: Secondary | ICD-10-CM | POA: Insufficient documentation

## 2021-09-14 DIAGNOSIS — Z79899 Other long term (current) drug therapy: Secondary | ICD-10-CM | POA: Diagnosis not present

## 2021-09-14 DIAGNOSIS — R509 Fever, unspecified: Secondary | ICD-10-CM | POA: Insufficient documentation

## 2021-09-14 DIAGNOSIS — Y900 Blood alcohol level of less than 20 mg/100 ml: Secondary | ICD-10-CM | POA: Diagnosis not present

## 2021-09-14 DIAGNOSIS — R112 Nausea with vomiting, unspecified: Secondary | ICD-10-CM | POA: Insufficient documentation

## 2021-09-14 DIAGNOSIS — I10 Essential (primary) hypertension: Secondary | ICD-10-CM | POA: Diagnosis not present

## 2021-09-14 DIAGNOSIS — Z046 Encounter for general psychiatric examination, requested by authority: Secondary | ICD-10-CM | POA: Diagnosis present

## 2021-09-14 DIAGNOSIS — R0602 Shortness of breath: Secondary | ICD-10-CM | POA: Diagnosis not present

## 2021-09-14 DIAGNOSIS — G20 Parkinson's disease: Secondary | ICD-10-CM | POA: Diagnosis not present

## 2021-09-14 LAB — CBC
HCT: 45.9 % (ref 39.0–52.0)
Hemoglobin: 15.1 g/dL (ref 13.0–17.0)
MCH: 31.1 pg (ref 26.0–34.0)
MCHC: 32.9 g/dL (ref 30.0–36.0)
MCV: 94.6 fL (ref 80.0–100.0)
Platelets: 297 10*3/uL (ref 150–400)
RBC: 4.85 MIL/uL (ref 4.22–5.81)
RDW: 13.2 % (ref 11.5–15.5)
WBC: 8.9 10*3/uL (ref 4.0–10.5)
nRBC: 0 % (ref 0.0–0.2)

## 2021-09-14 LAB — URINE DRUG SCREEN, QUALITATIVE (ARMC ONLY)
Amphetamines, Ur Screen: NOT DETECTED
Barbiturates, Ur Screen: NOT DETECTED
Benzodiazepine, Ur Scrn: NOT DETECTED
Cannabinoid 50 Ng, Ur ~~LOC~~: NOT DETECTED
Cocaine Metabolite,Ur ~~LOC~~: NOT DETECTED
MDMA (Ecstasy)Ur Screen: NOT DETECTED
Methadone Scn, Ur: NOT DETECTED
Opiate, Ur Screen: NOT DETECTED
Phencyclidine (PCP) Ur S: NOT DETECTED
Tricyclic, Ur Screen: NOT DETECTED

## 2021-09-14 LAB — COMPREHENSIVE METABOLIC PANEL
ALT: 5 U/L (ref 0–44)
AST: 27 U/L (ref 15–41)
Albumin: 4.3 g/dL (ref 3.5–5.0)
Alkaline Phosphatase: 67 U/L (ref 38–126)
Anion gap: 8 (ref 5–15)
BUN: 12 mg/dL (ref 8–23)
CO2: 30 mmol/L (ref 22–32)
Calcium: 9.7 mg/dL (ref 8.9–10.3)
Chloride: 102 mmol/L (ref 98–111)
Creatinine, Ser: 0.77 mg/dL (ref 0.61–1.24)
GFR, Estimated: >60 mL/min (ref >60)
Glucose, Bld: 98 mg/dL (ref 70–99)
Potassium: 4.3 mmol/L (ref 3.5–5.1)
Sodium: 140 mmol/L (ref 135–145)
Total Bilirubin: 1 mg/dL (ref 0.3–1.2)
Total Protein: 7.8 g/dL (ref 6.5–8.1)

## 2021-09-14 LAB — ETHANOL: Alcohol, Ethyl (B): <10 mg/dL (ref <10)

## 2021-09-14 LAB — RESP PANEL BY RT-PCR (FLU A&B, COVID) ARPGX2
Influenza A by PCR: NEGATIVE
Influenza B by PCR: NEGATIVE
SARS Coronavirus 2 by RT PCR: NEGATIVE

## 2021-09-14 LAB — ACETAMINOPHEN LEVEL: Acetaminophen (Tylenol), Serum: <10 ug/mL — ABNORMAL LOW (ref 10–30)

## 2021-09-14 LAB — SALICYLATE LEVEL: Salicylate Lvl: <7 mg/dL — ABNORMAL LOW (ref 7.0–30.0)

## 2021-09-14 MED ORDER — CLONAZEPAM 0.5 MG PO TABS
0.5000 mg | ORAL_TABLET | Freq: Every day | ORAL | Status: DC
  Administered 2021-09-14 – 2021-09-15 (×2): 0.5 mg via ORAL
  Filled 2021-09-14 (×2): qty 1

## 2021-09-14 MED ORDER — BUDESONIDE 0.5 MG/2ML IN SUSP
0.5000 mg | Freq: Two times a day (BID) | RESPIRATORY_TRACT | Status: DC
  Administered 2021-09-14: 0.5 mg via RESPIRATORY_TRACT
  Filled 2021-09-14 (×4): qty 2

## 2021-09-14 MED ORDER — UMECLIDINIUM BROMIDE 62.5 MCG/ACT IN AEPB
1.0000 | INHALATION_SPRAY | Freq: Every day | RESPIRATORY_TRACT | Status: DC
  Administered 2021-09-14 – 2021-09-15 (×2): 1 via RESPIRATORY_TRACT
  Filled 2021-09-14: qty 7

## 2021-09-14 MED ORDER — ASPIRIN EC 81 MG PO TBEC
81.0000 mg | DELAYED_RELEASE_TABLET | Freq: Every day | ORAL | Status: DC
  Administered 2021-09-14 – 2021-09-16 (×3): 81 mg via ORAL
  Filled 2021-09-14 (×3): qty 1

## 2021-09-14 MED ORDER — FLUTICASONE FUROATE-VILANTEROL 200-25 MCG/ACT IN AEPB: 1.0000 | INHALATION_SPRAY | Freq: Every day | RESPIRATORY_TRACT | Status: DC

## 2021-09-14 MED ORDER — AMLODIPINE BESYLATE 5 MG PO TABS
2.5000 mg | ORAL_TABLET | Freq: Every day | ORAL | Status: DC
  Administered 2021-09-15 – 2021-09-16 (×2): 2.5 mg via ORAL
  Filled 2021-09-14 (×2): qty 1

## 2021-09-14 MED ORDER — FLUTICASONE PROPIONATE 50 MCG/ACT NA SUSP: 1.0000 | Freq: Two times a day (BID) | NASAL | Status: DC | PRN

## 2021-09-14 MED ORDER — BENAZEPRIL HCL 10 MG PO TABS
10.0000 mg | ORAL_TABLET | Freq: Every day | ORAL | Status: DC
  Administered 2021-09-15: 10 mg via ORAL
  Filled 2021-09-14 (×2): qty 1

## 2021-09-14 MED ORDER — ALBUTEROL SULFATE (2.5 MG/3ML) 0.083% IN NEBU
3.0000 mL | INHALATION_SOLUTION | Freq: Four times a day (QID) | RESPIRATORY_TRACT | Status: DC | PRN
  Filled 2021-09-14: qty 3

## 2021-09-14 MED ORDER — CARBIDOPA-LEVODOPA 25-100 MG PO TABS
3.0000 | ORAL_TABLET | Freq: Four times a day (QID) | ORAL | Status: DC
  Administered 2021-09-14 – 2021-09-16 (×7): 3 via ORAL
  Filled 2021-09-14 (×9): qty 3

## 2021-09-14 MED ORDER — LORATADINE 10 MG PO TABS
10.0000 mg | ORAL_TABLET | Freq: Every day | ORAL | Status: DC
  Administered 2021-09-14 – 2021-09-16 (×3): 10 mg via ORAL
  Filled 2021-09-14 (×3): qty 1

## 2021-09-14 MED ORDER — VITAMIN D 25 MCG (1000 UNIT) PO TABS
1000.0000 [IU] | ORAL_TABLET | Freq: Every day | ORAL | Status: DC
  Administered 2021-09-14 – 2021-09-16 (×3): 1000 [IU] via ORAL
  Filled 2021-09-14 (×3): qty 1

## 2021-09-14 MED ORDER — ENTACAPONE 200 MG PO TABS
200.0000 mg | ORAL_TABLET | Freq: Four times a day (QID) | ORAL | Status: DC
  Administered 2021-09-14 – 2021-09-15 (×6): 200 mg via ORAL
  Filled 2021-09-14 (×9): qty 1

## 2021-09-14 MED ORDER — FLUTICASONE FUROATE-VILANTEROL 200-25 MCG/ACT IN AEPB
1.0000 | INHALATION_SPRAY | Freq: Every day | RESPIRATORY_TRACT | Status: DC
  Administered 2021-09-14 – 2021-09-15 (×2): 1 via RESPIRATORY_TRACT
  Filled 2021-09-14: qty 28

## 2021-09-14 MED ORDER — AMANTADINE HCL 100 MG PO CAPS
100.0000 mg | ORAL_CAPSULE | Freq: Every day | ORAL | Status: DC
  Administered 2021-09-14 – 2021-09-16 (×3): 100 mg via ORAL
  Filled 2021-09-14 (×4): qty 1

## 2021-09-14 MED ORDER — ATORVASTATIN CALCIUM 20 MG PO TABS
80.0000 mg | ORAL_TABLET | Freq: Every day | ORAL | Status: DC
  Administered 2021-09-14 – 2021-09-15 (×2): 80 mg via ORAL
  Filled 2021-09-14 (×2): qty 4

## 2021-09-14 MED ORDER — LORAZEPAM 1 MG PO TABS
1.0000 mg | ORAL_TABLET | Freq: Once | ORAL | Status: AC
Start: 2021-09-14 — End: 2021-09-14
  Administered 2021-09-14: 1 mg via ORAL
  Filled 2021-09-14: qty 1

## 2021-09-14 MED ORDER — CARVEDILOL 12.5 MG PO TABS
12.5000 mg | ORAL_TABLET | Freq: Two times a day (BID) | ORAL | Status: DC
  Administered 2021-09-14 – 2021-09-16 (×5): 12.5 mg via ORAL
  Filled 2021-09-14: qty 2
  Filled 2021-09-14: qty 1
  Filled 2021-09-14: qty 2
  Filled 2021-09-14: qty 1
  Filled 2021-09-14: qty 2

## 2021-09-14 MED ORDER — GABAPENTIN 100 MG PO CAPS
100.0000 mg | ORAL_CAPSULE | Freq: Three times a day (TID) | ORAL | Status: DC
  Administered 2021-09-14 – 2021-09-16 (×6): 100 mg via ORAL
  Filled 2021-09-14 (×6): qty 1

## 2021-09-14 NOTE — ED Notes (Signed)
Pt asking for his home medications.  EDP made aware.  Pharmacy to reconcile home medications.  ?

## 2021-09-14 NOTE — ED Notes (Signed)
TTS at bedside. 
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
## 2021-09-14 NOTE — ED Notes (Signed)
EDP at bedside  

## 2021-09-14 NOTE — ED Notes (Signed)
Patient resting quietly in room. No noted distress or abnormal behaviors noted. Will continue 15 minute checks. 

## 2021-09-14 NOTE — ED Notes (Signed)
IVC, pend placement 
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
## 2021-09-14 NOTE — BH Assessment (Addendum)
[facility] no available beds currently ? ?Referral information for Psychiatric Hospitalization faxed to;  ? ?[facility] [phone]),  ? ?[facility] [phone] ? ?[facility] [phone]), ? ?[facility] [phone]),  ? ?[facility] [phone] or [phone]),  ? ? ?

## 2021-09-14 NOTE — ED Notes (Signed)
Pt given phone 

## 2021-09-14 NOTE — ED Notes (Signed)
Pt given dinner tray.

## 2021-09-14 NOTE — ED Notes (Signed)
Snack and drink given 
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
## 2021-09-14 NOTE — ED Notes (Signed)
Pt to nurse's station stating he wanted to call his attorney.  "You have no right to hold me here."   ?

## 2021-09-15 ENCOUNTER — Emergency Department: Payer: Medicare HMO

## 2021-09-15 MED ORDER — ACETAMINOPHEN 500 MG PO TABS
1000.0000 mg | ORAL_TABLET | Freq: Once | ORAL | Status: AC
  Administered 2021-09-15: 1000 mg via ORAL

## 2021-09-15 MED ORDER — ACETAMINOPHEN 325 MG PO TABS: 650.0000 mg | ORAL_TABLET | Freq: Four times a day (QID) | ORAL | Status: DC | PRN

## 2021-09-15 MED ORDER — HYDROXYZINE HCL 25 MG PO TABS
25.0000 mg | ORAL_TABLET | Freq: Once | ORAL | Status: AC
  Administered 2021-09-15: 25 mg via ORAL
  Filled 2021-09-15: qty 1

## 2021-09-15 MED ORDER — ACETAMINOPHEN 325 MG PO TABS
650.0000 mg | ORAL_TABLET | Freq: Four times a day (QID) | ORAL | Status: DC | PRN
  Administered 2021-09-15: 650 mg via ORAL
  Filled 2021-09-15: qty 2

## 2021-09-15 NOTE — ED Notes (Signed)
Pt requesting ativan and medication for neck pain caused by continuous movements. Secure chat sent to Dr. Fuller Plan, will follow up ?

## 2021-09-15 NOTE — ED Notes (Signed)
Report received from Andrea, RN including SBAR. Patient alert and oriented, warm and dry, in no acute distress. Patient denies SI, HI, AVH and pain. Patient made aware of Q15 minute rounds and security cameras for their safety. Patient instructed to come to this nurse with needs or concerns.  

## 2021-09-15 NOTE — ED Notes (Signed)
Ambulated to bathroom without assistance.

## 2021-09-15 NOTE — Consult Note (Signed)
[facility] Psych ED Progress Note ? ?09/15/2021 4:09 PM ?Jeremiah Taylor.  ?MRN:  [id number] ? ? ?Method of visit?: Face to Face  ? ?Subjective: "I have an apartment; I need to go home. Patient is cooperative, but pressured speech. Continues to insist he is "fine to go." Inpatient psychiatric hospitalization continues to be recommended. "My wife gambles all my money away." She is not a [place]." ? ? ? ?Collateral from wife:  "He is delusional; threatens me." She reports he thinks he is appointed to unite gangs together. Leaves for a day after he gets his check, then he is broke. She has a restraining order.   ? ?Collateral from mother, Luan Maberry in [place] [phone]): "For the last 20 years, he gets mad, won't do right, go out and do drugs, talks about God, but "does stuff for the devil." They saw him in January the last time; he knows better to come around here "when he is cutting up." He "drinks and does drugs; talks about God, preaching. He was staying with his sisters and they finally sent him out because he would stay up all night. ?"I don't know what it is, but there is something. He is slick, he will lie. He is delusional. Thinks he knows everything." ?Parents report no formal psych diagnosis ? ?Principal Problem: Aggressive behavior ?Diagnosis:  Principal Problem: ?  Aggressive behavior ?Active Problems: ?  Parkinson's disease (HCC) ? ?Total Time spent with patient: 20 minutes ? ?Past Psychiatric History: denies ? ? ?Past Medical History:  ?Past Medical History:  ?Diagnosis Date  ? Anxiety   ? Asthma   ? Hypertension   ? Parkinson disease (HCC)   ?  ?Past Surgical History:  ?Procedure Laterality Date  ? CARPAL TUNNEL RELEASE Right   ? x3  ? CHOLECYSTECTOMY    ? OLECRANON BURSECTOMY Right 04/29/2021  ? Procedure: Right olecranon bursa excision;  Surgeon: Kennedy Bucker, MD;  Location: [facility] ORS;  Service: Orthopedics;  Laterality: Right;  ? REPLACEMENT TOTAL KNEE Right   ? ?Family History:  ?Family History   ?Problem Relation Age of Onset  ? Hypertension Mother   ? ?Family Psychiatric  History: not reported ? ?Social History:  ?Social History  ? ?Substance and Sexual Activity  ?Alcohol Use Not Currently  ?   ?Social History  ? ?Substance and Sexual Activity  ?Drug Use Not Currently  ?  ?Social History  ? ?Socioeconomic History  ? Marital status: Married  ?  Spouse name: Not on file  ? Number of children: Not on file  ? Years of education: Not on file  ? Highest education level: Not on file  ?Occupational History  ? Not on file  ?Tobacco Use  ? Smoking status: Every Day  ?  Types: Cigarettes  ? Smokeless tobacco: Never  ?Vaping Use  ? Vaping Use: Never used  ?Substance and Sexual Activity  ? Alcohol use: Not Currently  ? Drug use: Not Currently  ? Sexual activity: Not on file  ?Other Topics Concern  ? Not on file  ?Social History Narrative  ? Not on file  ? ?Social Determinants of Health  ? ?Financial Resource Strain: Not on file  ?Food Insecurity: Not on file  ?Transportation Needs: Not on file  ?Physical Activity: Not on file  ?Stress: Not on file  ?Social Connections: Not on file  ? ? ?Sleep: Fair ? ?Appetite:  Fair ? ?Current Medications: ?Current Facility-Administered Medications  ?Medication Dose Route Frequency Provider Last Rate Last Admin  ?  albuterol (PROVENTIL) (2.5 MG/3ML) 0.083% nebulizer solution 3 mL  3 mL Inhalation Q6H PRN Georga HackingMcHugh, Kelly Rose, MD      ? amantadine (SYMMETREL) capsule 100 mg  100 mg Oral Daily Georga HackingMcHugh, Kelly Rose, MD   100 mg at 09/15/21 [id number]  ? amLODipine (NORVASC) tablet 2.5 mg  2.5 mg Oral Daily Georga HackingMcHugh, Kelly Rose, MD   2.5 mg at 09/15/21 [id number]  ? And  ? benazepril (LOTENSIN) tablet 10 mg  10 mg Oral Daily Georga HackingMcHugh, Kelly Rose, MD   10 mg at 09/15/21 [id number]  ? aspirin EC tablet 81 mg  81 mg Oral Daily Georga HackingMcHugh, Kelly Rose, MD   81 mg at 09/15/21 [id number]  ? atorvastatin (LIPITOR) tablet 80 mg  80 mg Oral QHS Georga HackingMcHugh, Kelly Rose, MD   80 mg at 09/14/21 2116  ? budesonide (PULMICORT) nebulizer  solution 0.5 mg  0.5 mg Nebulization BID Georga HackingMcHugh, Kelly Rose, MD   0.5 mg at 09/14/21 2126  ? carbidopa-levodopa (SINEMET IR) 25-100 MG per tablet immediate release 3 tablet  3 tablet Oral QID Georga HackingMcHugh, Kelly Rose, MD   3 tablet at 09/15/21 1416  ? carvedilol (COREG) tablet 12.5 mg  12.5 mg Oral BID Georga HackingMcHugh, Kelly Rose, MD   12.5 mg at 09/15/21 0910  ? cholecalciferol (VITAMIN D3) tablet 1,000 Units  1,000 Units Oral Daily Georga HackingMcHugh, Kelly Rose, MD   1,000 Units at 09/15/21 0908  ? clonazePAM (KLONOPIN) tablet 0.5 mg  0.5 mg Oral QHS Georga HackingMcHugh, Kelly Rose, MD   0.5 mg at 09/14/21 2116  ? entacapone (COMTAN) tablet 200 mg  200 mg Oral QID Georga HackingMcHugh, Kelly Rose, MD   200 mg at 09/15/21 1415  ? fluticasone (FLONASE) 50 MCG/ACT nasal spray 1 spray  1 spray Each Nare BID PRN Georga HackingMcHugh, Kelly Rose, MD      ? fluticasone furoate-vilanterol (BREO ELLIPTA) 200-25 MCG/ACT 1 puff  1 puff Inhalation Daily Georga HackingMcHugh, Kelly Rose, MD   1 puff at 09/15/21 [phone]  ? And  ? umeclidinium bromide (INCRUSE ELLIPTA) 62.5 MCG/ACT 1 puff  1 puff Inhalation Daily Georga HackingMcHugh, Kelly Rose, MD   1 puff at 09/15/21 [id number]  ? gabapentin (NEURONTIN) capsule 100 mg  100 mg Oral TID Georga HackingMcHugh, Kelly Rose, MD   100 mg at 09/15/21 0908  ? loratadine (CLARITIN) tablet 10 mg  10 mg Oral Daily Georga HackingMcHugh, Kelly Rose, MD   10 mg at 09/15/21 0908  ? ?Current Outpatient Medications  ?Medication Sig Dispense Refill  ? albuterol (VENTOLIN HFA) 108 (90 Base) MCG/ACT inhaler Inhale 1-2 puffs into the lungs every 6 (six) hours as needed for wheezing or shortness of breath.    ? Amantadine HCl 100 MG tablet Take 100 mg by mouth daily.    ? amLODipine-benazepril (LOTREL) 10-40 MG capsule Take 1 capsule by mouth daily.    ? aspirin EC 81 MG EC tablet Take 1 tablet (81 mg total) by mouth daily. Swallow whole. 30 tablet 11  ? atorvastatin (LIPITOR) 80 MG tablet Take 80 mg by mouth at bedtime.    ? budesonide (PULMICORT) 0.5 MG/2ML nebulizer solution Take 0.5 mg by nebulization in the morning and at  bedtime.    ? carbidopa-levodopa (SINEMET IR) 25-100 MG tablet Take 3 tablets by mouth 4 (four) times daily.    ? carvedilol (COREG) 12.5 MG tablet Take 12.5 mg by mouth 2 (two) times daily.    ? cetirizine (ZYRTEC) 10 MG tablet Take 10 mg by mouth daily.    ? cholecalciferol (VITAMIN  D3) 25 MCG (1000 UNIT) tablet Take 1,000 Units by mouth daily.    ? clonazePAM (KLONOPIN) 0.5 MG tablet Take 0.5 mg by mouth at bedtime.    ? entacapone (COMTAN) 200 MG tablet Take 200 mg by mouth 4 (four) times daily as needed.    ? fluticasone (FLONASE) 50 MCG/ACT nasal spray Place 1 spray into the nose 2 (two) times daily as needed for allergies.    ? gabapentin (NEURONTIN) 100 MG capsule Take 1 capsule (100 mg total) by mouth 3 (three) times daily. 90 capsule 0  ? meloxicam (MOBIC) 15 MG tablet Take 15 mg by mouth in the morning.    ? SYMBICORT 160-4.5 MCG/ACT inhaler Inhale 2 puffs into the lungs 2 (two) times daily.    ? TRELEGY ELLIPTA 200-62.5-25 MCG/ACT AEPB Inhale 1 puff into the lungs daily.    ? Amantadine HCl 100 MG tablet Take 1 tablet (100 mg total) by mouth daily. 30 tablet 0  ? carvedilol (COREG) 25 MG tablet Take 25 mg by mouth 2 (two) times daily with a meal. (Patient not taking: Reported on 07/29/2021)    ? escitalopram (LEXAPRO) 10 MG tablet Take 10 mg by mouth daily. (Patient not taking: Reported on 09/14/2021)    ? ibuprofen (ADVIL) 600 MG tablet Take 600 mg by mouth every 8 (eight) hours as needed.    ? Multiple Vitamin (MULTIVITAMIN WITH MINERALS) TABS tablet Take by mouth daily.    ? ? ?Lab Results:  ?Results for orders placed or performed during the hospital encounter of 09/14/21 (from the past 48 hour(s))  ?Comprehensive metabolic panel     Status: None  ? Collection Time: 09/14/21 12:47 PM  ?Result Value Ref Range  ? Sodium 140 135 - 145 mmol/L  ? Potassium 4.3 3.5 - 5.1 mmol/L  ? Chloride 102 98 - 111 mmol/L  ? CO2 30 22 - 32 mmol/L  ? Glucose, Bld 98 70 - 99 mg/dL  ?  Comment: Glucose reference range applies  only to samples taken after fasting for at least 8 hours.  ? BUN 12 8 - 23 mg/dL  ? Creatinine, Ser 0.77 0.61 - 1.24 mg/dL  ? Calcium 9.7 8.9 - 10.3 mg/dL  ? Total Protein 7.8 6.5 - 8.1 g/dL  ? Albumin 4.3 3.

## 2021-09-15 NOTE — ED Notes (Signed)
Pt concerned that he has to speak to his wife to get his bills corrected. Pt educated on phone hours and expresses understanding. Pt rambles on about different accounts and situations he has to correct financially ?

## 2021-09-15 NOTE — ED Notes (Signed)
Pt moved to BHU 3. Explained rules and guidelines of BHU including use of security cameras in the rooms and telephone / visitor hours. Pt asking when he will be able to go home. Pt states his wife took out papers on him because he was going to leave her and move into an apartment. Pt states he does not want to hurt her or anyone else and has never even been in a fight. Pt reassured that this nurse would update him as soon as possible.  Pt verbalized understanding.  ?

## 2021-09-15 NOTE — ED Notes (Signed)
Report given to Annie RN 

## 2021-09-15 NOTE — ED Notes (Signed)
Breakfast tray with juice was given. 

## 2021-09-15 NOTE — ED Notes (Signed)
IVC/  PENDING  PLACEMENT 

## 2021-09-15 NOTE — ED Provider Notes (Signed)
Emergency Medicine Observation Re-evaluation Note ? ?Jeremiah Taylor. is a 61 y.o. male, seen on rounds today.  Pt initially presented to the ED for complaints of Mental Health Problem ?Currently, the patient is resting, voices no medical complaints. ? ?Physical Exam  ?BP (!) 131/93 (BP Location: Right Arm)   Pulse 65   Temp 97.8 ?F (36.6 ?C) (Oral)   Resp 20   Ht 5\' 11"  (1.803 m)   Wt 72.6 kg   SpO2 96%   BMI 22.32 kg/m?  ?Physical Exam ?General: Resting in no acute distress ?Cardiac: No cyanosis ?Lungs: Equal rise and fall ?Psych: Not agitated ? ?ED Course / MDM  ?EKG:  ? ?I have reviewed the labs performed to date as well as medications administered while in observation.  Recent changes in the last 24 hours include no events overnight. ? ?Plan  ?Current plan is for psychiatric disposition. ?Jeremiah Taylor. is under involuntary commitment. ?  ? ?  ?Tretha Sciara, MD ?09/15/21 [phone] ? ?

## 2021-09-16 ENCOUNTER — Other Ambulatory Visit: Payer: Self-pay

## 2021-09-16 ENCOUNTER — Encounter: Payer: Self-pay | Admitting: Psychiatry

## 2021-09-16 ENCOUNTER — Inpatient Hospital Stay
Admission: AD | Admit: 2021-09-16 | Discharge: 2021-09-18 | DRG: 885 | Discharge status: Against Medical Advice | Payer: Medicare HMO | Source: Intra-hospital | Attending: Psychiatry | Admitting: Psychiatry

## 2021-09-16 DIAGNOSIS — F3181 Bipolar II disorder: Secondary | ICD-10-CM | POA: Diagnosis present

## 2021-09-16 DIAGNOSIS — Z87891 Personal history of nicotine dependence: Secondary | ICD-10-CM

## 2021-09-16 DIAGNOSIS — Z7982 Long term (current) use of aspirin: Secondary | ICD-10-CM

## 2021-09-16 DIAGNOSIS — Z9049 Acquired absence of other specified parts of digestive tract: Secondary | ICD-10-CM

## 2021-09-16 DIAGNOSIS — R456 Violent behavior: Secondary | ICD-10-CM | POA: Diagnosis not present

## 2021-09-16 DIAGNOSIS — Z79899 Other long term (current) drug therapy: Secondary | ICD-10-CM

## 2021-09-16 DIAGNOSIS — F419 Anxiety disorder, unspecified: Secondary | ICD-10-CM | POA: Diagnosis present

## 2021-09-16 DIAGNOSIS — Z8249 Family history of ischemic heart disease and other diseases of the circulatory system: Secondary | ICD-10-CM | POA: Diagnosis not present

## 2021-09-16 DIAGNOSIS — I10 Essential (primary) hypertension: Secondary | ICD-10-CM | POA: Diagnosis present

## 2021-09-16 DIAGNOSIS — F329 Major depressive disorder, single episode, unspecified: Secondary | ICD-10-CM | POA: Diagnosis present

## 2021-09-16 DIAGNOSIS — F332 Major depressive disorder, recurrent severe without psychotic features: Secondary | ICD-10-CM | POA: Diagnosis not present

## 2021-09-16 DIAGNOSIS — Z96651 Presence of right artificial knee joint: Secondary | ICD-10-CM | POA: Diagnosis present

## 2021-09-16 DIAGNOSIS — Z7951 Long term (current) use of inhaled steroids: Secondary | ICD-10-CM

## 2021-09-16 DIAGNOSIS — G20 Parkinson's disease: Secondary | ICD-10-CM | POA: Diagnosis present

## 2021-09-16 MED ORDER — ATORVASTATIN CALCIUM 80 MG PO TABS
80.0000 mg | ORAL_TABLET | Freq: Every day | ORAL | Status: DC
  Administered 2021-09-16 – 2021-09-17 (×2): 80 mg via ORAL
  Filled 2021-09-16 (×2): qty 1

## 2021-09-16 MED ORDER — ENTACAPONE 200 MG PO TABS
200.0000 mg | ORAL_TABLET | Freq: Four times a day (QID) | ORAL | Status: DC
  Administered 2021-09-16 – 2021-09-18 (×8): 200 mg via ORAL
  Filled 2021-09-16 (×10): qty 1

## 2021-09-16 MED ORDER — ALBUTEROL SULFATE (2.5 MG/3ML) 0.083% IN NEBU: 3.0000 mL | INHALATION_SOLUTION | Freq: Four times a day (QID) | RESPIRATORY_TRACT | Status: DC | PRN

## 2021-09-16 MED ORDER — AMLODIPINE BESYLATE 5 MG PO TABS
2.5000 mg | ORAL_TABLET | Freq: Once | ORAL | Status: AC
  Administered 2021-09-16: 2.5 mg via ORAL
  Filled 2021-09-16: qty 1

## 2021-09-16 MED ORDER — CLONAZEPAM 0.5 MG PO TABS
0.5000 mg | ORAL_TABLET | Freq: Every day | ORAL | Status: DC
  Administered 2021-09-16 – 2021-09-17 (×2): 0.5 mg via ORAL
  Filled 2021-09-16 (×2): qty 1

## 2021-09-16 MED ORDER — MAGNESIUM HYDROXIDE 400 MG/5ML PO SUSP: 30.0000 mL | Freq: Every day | ORAL | Status: DC | PRN

## 2021-09-16 MED ORDER — BUDESONIDE 0.5 MG/2ML IN SUSP
0.5000 mg | Freq: Two times a day (BID) | RESPIRATORY_TRACT | Status: DC
  Administered 2021-09-16 – 2021-09-18 (×4): 0.5 mg via RESPIRATORY_TRACT
  Filled 2021-09-16 (×5): qty 2

## 2021-09-16 MED ORDER — VITAMIN D 25 MCG (1000 UNIT) PO TABS
1000.0000 [IU] | ORAL_TABLET | Freq: Every day | ORAL | Status: DC
  Administered 2021-09-17 – 2021-09-18 (×2): 1000 [IU] via ORAL
  Filled 2021-09-16 (×2): qty 1

## 2021-09-16 MED ORDER — UMECLIDINIUM BROMIDE 62.5 MCG/ACT IN AEPB
1.0000 | INHALATION_SPRAY | Freq: Every day | RESPIRATORY_TRACT | Status: DC
  Administered 2021-09-17 – 2021-09-18 (×2): 1 via RESPIRATORY_TRACT
  Filled 2021-09-16: qty 7

## 2021-09-16 MED ORDER — BENAZEPRIL HCL 10 MG PO TABS
10.0000 mg | ORAL_TABLET | Freq: Every day | ORAL | Status: DC
  Administered 2021-09-17 – 2021-09-18 (×2): 10 mg via ORAL
  Filled 2021-09-16 (×2): qty 1

## 2021-09-16 MED ORDER — FLUTICASONE PROPIONATE 50 MCG/ACT NA SUSP
1.0000 | Freq: Two times a day (BID) | NASAL | Status: DC | PRN
  Filled 2021-09-16: qty 16

## 2021-09-16 MED ORDER — AMANTADINE HCL 100 MG PO CAPS
100.0000 mg | ORAL_CAPSULE | Freq: Every day | ORAL | Status: DC
Start: 2021-09-17 — End: 2021-09-18
  Administered 2021-09-17 – 2021-09-18 (×2): 100 mg via ORAL
  Filled 2021-09-16 (×2): qty 1

## 2021-09-16 MED ORDER — ACETAMINOPHEN 325 MG PO TABS
650.0000 mg | ORAL_TABLET | Freq: Four times a day (QID) | ORAL | Status: DC | PRN
  Administered 2021-09-17 – 2021-09-18 (×2): 650 mg via ORAL
  Filled 2021-09-16 (×2): qty 2

## 2021-09-16 MED ORDER — CARBIDOPA-LEVODOPA 25-100 MG PO TABS
3.0000 | ORAL_TABLET | Freq: Four times a day (QID) | ORAL | Status: DC
  Administered 2021-09-16 – 2021-09-17 (×5): 3 via ORAL
  Filled 2021-09-16 (×5): qty 3

## 2021-09-16 MED ORDER — QUETIAPINE FUMARATE 100 MG PO TABS
100.0000 mg | ORAL_TABLET | Freq: Every evening | ORAL | Status: DC | PRN
  Administered 2021-09-16 – 2021-09-17 (×2): 100 mg via ORAL
  Filled 2021-09-16 (×2): qty 1

## 2021-09-16 MED ORDER — ASPIRIN EC 81 MG PO TBEC
81.0000 mg | DELAYED_RELEASE_TABLET | Freq: Every day | ORAL | Status: DC
  Administered 2021-09-17 – 2021-09-18 (×2): 81 mg via ORAL
  Filled 2021-09-16 (×2): qty 1

## 2021-09-16 MED ORDER — LORATADINE 10 MG PO TABS
10.0000 mg | ORAL_TABLET | Freq: Every day | ORAL | Status: DC
  Administered 2021-09-17 – 2021-09-18 (×2): 10 mg via ORAL
  Filled 2021-09-16 (×2): qty 1

## 2021-09-16 MED ORDER — FLUTICASONE FUROATE-VILANTEROL 200-25 MCG/ACT IN AEPB
1.0000 | INHALATION_SPRAY | Freq: Every day | RESPIRATORY_TRACT | Status: DC
  Administered 2021-09-17 – 2021-09-18 (×2): 1 via RESPIRATORY_TRACT
  Filled 2021-09-16: qty 28

## 2021-09-16 MED ORDER — ALUM & MAG HYDROXIDE-SIMETH 200-200-20 MG/5ML PO SUSP: 30.0000 mL | ORAL | Status: DC | PRN

## 2021-09-16 MED ORDER — CARVEDILOL 6.25 MG PO TABS
12.5000 mg | ORAL_TABLET | Freq: Two times a day (BID) | ORAL | Status: DC
  Administered 2021-09-16 – 2021-09-18 (×4): 12.5 mg via ORAL
  Filled 2021-09-16 (×4): qty 2

## 2021-09-16 MED ORDER — BENAZEPRIL HCL 10 MG PO TABS
10.0000 mg | ORAL_TABLET | Freq: Once | ORAL | Status: AC
  Administered 2021-09-16: 10 mg via ORAL
  Filled 2021-09-16: qty 1

## 2021-09-16 MED ORDER — GABAPENTIN 100 MG PO CAPS
100.0000 mg | ORAL_CAPSULE | Freq: Three times a day (TID) | ORAL | Status: DC
  Administered 2021-09-16 – 2021-09-18 (×6): 100 mg via ORAL
  Filled 2021-09-16 (×6): qty 1

## 2021-09-16 MED ORDER — AMLODIPINE BESYLATE 5 MG PO TABS
2.5000 mg | ORAL_TABLET | Freq: Every day | ORAL | Status: DC
Start: 2021-09-17 — End: 2021-09-18
  Administered 2021-09-17 – 2021-09-18 (×2): 2.5 mg via ORAL
  Filled 2021-09-16 (×2): qty 1

## 2021-09-16 MED ORDER — LORAZEPAM 1 MG PO TABS
1.0000 mg | ORAL_TABLET | ORAL | Status: DC | PRN
  Administered 2021-09-16 – 2021-09-17 (×2): 1 mg via ORAL
  Filled 2021-09-16 (×2): qty 1

## 2021-09-16 NOTE — Tx Team (Signed)
Initial Treatment Plan ?09/16/2021 ?1:16 PM ?Tretha Sciara. ?[id number] ? ? ? ?PATIENT STRESSORS: ?Marital or family conflict   ? ? ?PATIENT STRENGTHS: ?Religious Affiliation  ? ? ?PATIENT IDENTIFIED PROBLEMS: ?Family conflicts  ?  ?  ?  ?  ?  ?  ?  ?  ?  ? ?DISCHARGE CRITERIA:  ?Adequate post-discharge living arrangements ?Improved stabilization in mood, thinking, and/or behavior ? ?PRELIMINARY DISCHARGE PLAN: ?Placement in alternative living arrangements ? ?PATIENT/FAMILY INVOLVEMENT: ?This treatment plan has been presented to and reviewed with the patient, Leticia Mcdiarmid..  The patient has been given the opportunity to ask questions and make suggestions. ? ?Jasper Riling, RN ?09/16/2021, 1:16 PM ?

## 2021-09-16 NOTE — Progress Notes (Signed)
Patient admitted IVC to [facility] from the ED with diagnosis of depression and Parkinson. Patient presents to unit ambulatory alert and oriented x4.  Patient states, "I really don't know why I even need to be here., I did nothing wrong and I am not crazy." Patient is cooperative, speech is rapid. Patient's affect is extremely anxious and worried. Patient endorses anxiety stating his main stressor is "being here because he has a lot of bills to pay today and he is also supposed to be paying for his new apartment today." Patient currently denies suicidal ideations, homicidal ideations, audio or visual hallucinations and verbally contracts for safety on unit.  Patient denies pain at this time. Patient denies drinking alcohol,  smoking or drug abuse.  Patient reports living with his wife but was working on getting his own apartment due to their differences. His support system are his kids (2 daughters and a son) and they are all out of state. When asked what are the goals he would like to work on while here pt states, "I want to show you all that nothing is wrong with me and I want to get out of here ASAP."  ?Emotional support and reassurance provided throughout admission intake. Oriented patient to unit, room and call light, reviewed POC with all questions answered and understanding verbalzied. Medications administered as ordered.  Pt compliant with all meds, swallows without difficulty and ate 100% of his lunch. Placed patient on moderate to high risk for fall precautions per policy. Patient refused unit's rolling walker for ambulation. Patient denies any needs at this time.  Will continue to monitor with ongoing Q 15 minute safety checks per unit protocol.  ?

## 2021-09-16 NOTE — ED Notes (Signed)
Report received from Christopher B, RN including SBAR. On initial round after report Pt is warm/dry, resting quietly in room without any s/s of distress.  Will continue to monitor throughout shift as ordered for any changes in behaviors and for continued safety.   

## 2021-09-16 NOTE — ED Provider Notes (Signed)
Emergency Medicine Observation Re-evaluation Note ? ?Jeremiah Ruckert. is a 61 y.o. male, seen on rounds today.  ? ?Physical Exam  ?BP (!) 145/111 (BP Location: Right Arm)   Pulse 93   Temp 98 ?F (36.7 ?C) (Oral)   Resp 18   Ht 5\' 11"  (1.803 m)   Wt 72.6 kg   SpO2 94%   BMI 22.32 kg/m?  ?Physical Exam ?General: Patient resting comfortably in bed ?Lungs: Patient in no respiratory distress ?Psych: Patient not combative ? ?ED Course / MDM  ?EKG:EKG Interpretation ? ?Date/Time:  Sunday September 14 2021 13:23:11 EDT ?Ventricular Rate:  82 ?PR Interval:  178 ?QRS Duration: 72 ?QT Interval:  352 ?QTC Calculation: 411 ?R Axis:   61 ?Text Interpretation: Normal sinus rhythm Cannot rule out Anterior infarct , age undetermined Abnormal ECG When compared with ECG of 10-Aug-2021 11:40, No significant change was found Confirmed by UNCONFIRMED, DOCTOR ([id number]), editor Mel Almond, Tammy [phone]) on 09/15/2021 10:04:35 AM ? ? ? ?Plan  ?Current plan is for psychiatric evaluation.  In addition we will have to monitor the patient's blood pressure.  If it remains elevated we may need to increase his antihypertensive medication. ?Jeremiah Taylor. is under involuntary commitment. ?  ? ?  ?Nena Polio, MD ?09/16/21 [phone] ? ?

## 2021-09-16 NOTE — Progress Notes (Signed)
Recreation Therapy Notes ?  ?Date: 09/16/2021 ?  ?Time: 1:05pm  ? ?Location: Courtyard   ?  ?Behavioral response: N/A ?  ?Intervention Topic: Leisure  ?  ?Discussion/Intervention: ?Patient refused to attend group.  ?  ?Clinical Observations/Feedback:  ?Patient refused to attend group.  ?  ?Money Mckeithan LRT/CTRS ? ? ? ? ? ? ? ?Aevah Stansbery ?09/16/2021 2:06 PM ?

## 2021-09-16 NOTE — BH Assessment (Signed)
Patient is to be admitted to [facility] UNIT by Dr.  Marlou Porch .  ?Attending Physician will be Dr.  Marlou Porch .   ?Patient has been assigned to room L26, by [facility] Charge Nurse, Mimi.   ? ?ER staff is aware of the admission: ?Luann, ER Secretary   ?Dr. Darnelle Catalan, ER MD  ?Florentina Addison, Patient's Nurse  ?Sue Lush, Patient Access.  ?

## 2021-09-17 DIAGNOSIS — F332 Major depressive disorder, recurrent severe without psychotic features: Secondary | ICD-10-CM

## 2021-09-17 DIAGNOSIS — F3181 Bipolar II disorder: Principal | ICD-10-CM

## 2021-09-17 MED ORDER — CARBIDOPA-LEVODOPA 25-100 MG PO TABS
3.0000 | ORAL_TABLET | Freq: Four times a day (QID) | ORAL | Status: DC
  Administered 2021-09-17 – 2021-09-18 (×4): 3 via ORAL
  Filled 2021-09-17 (×4): qty 3

## 2021-09-17 NOTE — BHH Suicide Risk Assessment (Signed)
[facility] Admission Suicide Risk Assessment ? ? ?Nursing information obtained from:  Patient ?Demographic factors:  Male ?Current Mental Status:  NA ?Loss Factors:  NA ?Historical Factors:  NA ?Risk Reduction Factors:  NA ? ?Total Time spent with patient: 1 hour ?Principal Problem: MDD (major depressive disorder) ?Diagnosis:  Principal Problem: ?  MDD (major depressive disorder) ? ?Subjective Data: 61 yo male who presents based on his wife reports a significant change in behavior.  He saw his neurologist on Thursday and he wanted him to come to the ED for an evaluation.  On assessment, he is pleasant with severe body jerks and states he needs his Parkinson's medicine.  Meanwhile, he reports he moved from [place] to [place] and met his wife.  They have been married 8 months and he is upset that she does not believe in God.  According to his wife, they have known each other for a year.  "I don't have a behavioral problem, I'm just stressed."  Denies suicidal/homicidal ideations, hallucinations, and substance abuse.  He says he is leaving his wife tomorrow and has a place to go. ?  ?Wife of 8 months, Kyshaun Barnette:  She reports his behavior has changed since January and he is hyper-religous, He needs "care around the clock".  "He's just delusional, says God is telling him to do all these things."  "He needs to go some place.  I've been telling him to leave.  There is something wrong with him, he'll tell you something else."  She told the EDP that she is taking domestic violence charges out on him tomorrow so he cannot return home. ?  ?Per his daughter in [place], Lars Mage, states his behavior has changed and he texted his wife   "He throws a temper tantrum when he doesn't get his way."  She reports he is up all night and threatens his wife.  Texted his wife he was going to kill himself and she called his children.  When she talked to him, he told he was going to the mountains and alluded he was going to do something.  His wife has  been calling her when he is having these erratic behaviors. ? ?Continued Clinical Symptoms:  ?Alcohol Use Disorder Identification Test Final Score (AUDIT): 0 ?The "Alcohol Use Disorders Identification Test", Guidelines for Use in Primary Care, Second Edition.  [facility]). ?Score between 0-7:  no or low risk or alcohol related problems. ?Score between 8-15:  moderate risk of alcohol related problems. ?Score between 16-19:  high risk of alcohol related problems. ?Score 20 or above:  warrants further diagnostic evaluation for alcohol dependence and treatment. ? ? ?CLINICAL FACTORS:  ? Bipolar Disorder:   Bipolar II ? ? ?Musculoskeletal: ?Strength & Muscle Tone: spastic ?Gait & Station: normal ?Patient leans: N/A ? ?Psychiatric Specialty Exam: ? ?Presentation  ?General Appearance: Appropriate for Environment ? ?Eye Contact:Good ? ?Speech:Clear and Coherent ? ?Speech Volume:Normal ? ?Handedness:No data recorded ? ?Mood and Affect  ?Mood:Irritable; Anxious ? ?Affect:Congruent ? ? ?Thought Process  ?Thought Processes:-- (delusional) ? ?Descriptions of Associations:No data recorded ?Orientation:Full (Time, Place and Person) ? ?Thought Content:Delusions ? ?History of Schizophrenia/Schizoaffective disorder:No ? ?Duration of Psychotic Symptoms:Greater than six months ? ?Hallucinations:No data recorded ?Ideas of Reference:None ? ?Suicidal Thoughts:No data recorded ?Homicidal Thoughts:No data recorded ? ?Sensorium  ?Memory:Immediate Fair ? ?Judgment:Poor ? ?Insight:Poor ? ? ?Executive Functions  ?Concentration:Poor ? ?Attention Span:Fair ? ?Recall:Fair ? ?[place] ? ?Language:Fair ? ? ?Psychomotor Activity  ?Psychomotor Activity:No data  recorded ? ?Assets  ?Assets:Resilience ? ? ?Sleep  ?Sleep:No data recorded ? ? ? ?Blood pressure (!) 141/98, pulse 61, temperature 97.6 ?F (36.4 ?C), temperature source Oral, resp. rate 18, height _0  (1.803 m), weight 72 kg, SpO2 100 %. Body mass index is  22.14 kg/m?. ? ? ?COGNITIVE FEATURES THAT CONTRIBUTE TO RISK:  ?Thought constriction (tunnel vision)   ? ?SUICIDE RISK:  ? Minimal: No identifiable suicidal ideation.  Patients presenting with no risk factors but with morbid ruminations; may be classified as minimal risk based on the severity of the depressive symptoms ? ?PLAN OF CARE: See orders ? ?I certify that inpatient services furnished can reasonably be expected to improve the patient's condition.  ? ?Parks Ranger, DO ?09/17/2021, 10:53 AM ? ?

## 2021-09-17 NOTE — BH IP Treatment Plan (Signed)
Interdisciplinary Treatment and Diagnostic Plan Update ? ?09/17/2021 ?Time of Session: 10:00AM ?Bonne Dolores. ?MRN: [id number] ? ?Principal Diagnosis: Bipolar 2 disorder, major depressive episode ([place]) ? ?Secondary Diagnoses: Principal Problem: ?  Bipolar 2 disorder, major depressive episode ([place]) ?Active Problems: ?  MDD (major depressive disorder) ? ? ?Current Medications:  ?Current Facility-Administered Medications  ?Medication Dose Route Frequency Provider Last Rate Last Admin  ? acetaminophen (TYLENOL) tablet 650 mg  650 mg Oral Q6H PRN Waldon Merl F, NP      ? albuterol (PROVENTIL) (2.5 MG/3ML) 0.083% nebulizer solution 3 mL  3 mL Inhalation Q6H PRN Sherlon Handing, NP      ? alum & mag hydroxide-simeth (MAALOX/MYLANTA) 200-200-20 MG/5ML suspension 30 mL  30 mL Oral Q4H PRN Waldon Merl F, NP      ? amantadine (SYMMETREL) capsule 100 mg  100 mg Oral Daily Waldon Merl F, NP   100 mg at 09/17/21 8938  ? amLODipine (NORVASC) tablet 2.5 mg  2.5 mg Oral Daily Waldon Merl F, NP   2.5 mg at 09/17/21 1017  ? And  ? benazepril (LOTENSIN) tablet 10 mg  10 mg Oral Daily Waldon Merl F, NP   10 mg at 09/17/21 5102  ? aspirin EC tablet 81 mg  81 mg Oral Daily Sherlon Handing, NP   81 mg at 09/17/21 5852  ? atorvastatin (LIPITOR) tablet 80 mg  80 mg Oral QHS Waldon Merl F, NP   80 mg at 09/16/21 2110  ? budesonide (PULMICORT) nebulizer solution 0.5 mg  0.5 mg Nebulization BID Waldon Merl F, NP   0.5 mg at 09/17/21 0900  ? carbidopa-levodopa (SINEMET IR) 25-100 MG per tablet immediate release 3 tablet  3 tablet Oral QID Sherlon Handing, NP   3 tablet at 09/17/21 7782  ? carvedilol (COREG) tablet 12.5 mg  12.5 mg Oral BID Waldon Merl F, NP   12.5 mg at 09/17/21 4235  ? cholecalciferol (VITAMIN D3) tablet 1,000 Units  1,000 Units Oral Daily Sherlon Handing, NP   1,000 Units at 09/17/21 3614  ? clonazePAM (KLONOPIN) tablet 0.5 mg  0.5 mg Oral QHS Waldon Merl F, NP   0.5 mg  at 09/16/21 2110  ? entacapone (COMTAN) tablet 200 mg  200 mg Oral QID Waldon Merl F, NP   200 mg at 09/17/21 4315  ? fluticasone (FLONASE) 50 MCG/ACT nasal spray 1 spray  1 spray Each Nare BID PRN Waldon Merl F, NP      ? fluticasone furoate-vilanterol (BREO ELLIPTA) 200-25 MCG/ACT 1 puff  1 puff Inhalation Daily Waldon Merl F, NP   1 puff at 09/17/21 0900  ? And  ? umeclidinium bromide (INCRUSE ELLIPTA) 62.5 MCG/ACT 1 puff  1 puff Inhalation Daily Waldon Merl F, NP   1 puff at 09/17/21 0900  ? gabapentin (NEURONTIN) capsule 100 mg  100 mg Oral TID Sherlon Handing, NP   100 mg at 09/17/21 4008  ? loratadine (CLARITIN) tablet 10 mg  10 mg Oral Daily Waldon Merl F, NP   10 mg at 09/17/21 6761  ? LORazepam (ATIVAN) tablet 1 mg  1 mg Oral Q4H PRN Parks Ranger, DO   1 mg at 09/16/21 1954  ? magnesium hydroxide (MILK OF MAGNESIA) suspension 30 mL  30 mL Oral Daily PRN Sherlon Handing, NP      ? QUEtiapine (SEROQUEL) tablet 100 mg  100 mg Oral QHS PRN Parks Ranger, DO   100 mg at  09/16/21 2302  ? ?PTA Medications: ?Medications Prior to Admission  ?Medication Sig Dispense Refill Last Dose  ? albuterol (VENTOLIN HFA) 108 (90 Base) MCG/ACT inhaler Inhale 1-2 puffs into the lungs every 6 (six) hours as needed for wheezing or shortness of breath.     ? Amantadine HCl 100 MG tablet Take 1 tablet (100 mg total) by mouth daily. 30 tablet 0   ? Amantadine HCl 100 MG tablet Take 100 mg by mouth daily.     ? amLODipine-benazepril (LOTREL) 10-40 MG capsule Take 1 capsule by mouth daily.     ? aspirin EC 81 MG EC tablet Take 1 tablet (81 mg total) by mouth daily. Swallow whole. 30 tablet 11   ? atorvastatin (LIPITOR) 80 MG tablet Take 80 mg by mouth at bedtime.     ? budesonide (PULMICORT) 0.5 MG/2ML nebulizer solution Take 0.5 mg by nebulization in the morning and at bedtime.     ? carbidopa-levodopa (SINEMET IR) 25-100 MG tablet Take 3 tablets by mouth 4 (four) times daily.     ?  carvedilol (COREG) 12.5 MG tablet Take 12.5 mg by mouth 2 (two) times daily.     ? carvedilol (COREG) 25 MG tablet Take 25 mg by mouth 2 (two) times daily with a meal. (Patient not taking: Reported on 07/29/2021)     ? cetirizine (ZYRTEC) 10 MG tablet Take 10 mg by mouth daily.     ? cholecalciferol (VITAMIN D3) 25 MCG (1000 UNIT) tablet Take 1,000 Units by mouth daily.     ? clonazePAM (KLONOPIN) 0.5 MG tablet Take 0.5 mg by mouth at bedtime.     ? entacapone (COMTAN) 200 MG tablet Take 200 mg by mouth 4 (four) times daily as needed.     ? escitalopram (LEXAPRO) 10 MG tablet Take 10 mg by mouth daily. (Patient not taking: Reported on 09/14/2021)     ? fluticasone (FLONASE) 50 MCG/ACT nasal spray Place 1 spray into the nose 2 (two) times daily as needed for allergies.     ? gabapentin (NEURONTIN) 100 MG capsule Take 1 capsule (100 mg total) by mouth 3 (three) times daily. 90 capsule 0   ? ibuprofen (ADVIL) 600 MG tablet Take 600 mg by mouth every 8 (eight) hours as needed.     ? meloxicam (MOBIC) 15 MG tablet Take 15 mg by mouth in the morning.     ? Multiple Vitamin (MULTIVITAMIN WITH MINERALS) TABS tablet Take by mouth daily.     ? SYMBICORT 160-4.5 MCG/ACT inhaler Inhale 2 puffs into the lungs 2 (two) times daily.     ? TRELEGY ELLIPTA 200-62.5-25 MCG/ACT AEPB Inhale 1 puff into the lungs daily.     ? ? ?Patient Stressors: Marital or family conflict   ? ?Patient Strengths: Religious Affiliation  ? ?Treatment Modalities: Medication Management, Group therapy, Case management,  ?1 to 1 session with clinician, Psychoeducation, Recreational therapy. ? ? ?Physician Treatment Plan for Primary Diagnosis: Bipolar 2 disorder, major depressive episode ([place]) ?Long Term Goal(s): Improvement in symptoms so as ready for discharge  ? ?Short Term Goals: Ability to identify changes in lifestyle to reduce recurrence of condition will improve ?Ability to verbalize feelings will improve ?Ability to disclose and discuss suicidal  ideas ?Ability to demonstrate self-control will improve ?Ability to identify and develop effective coping behaviors will improve ?Ability to maintain clinical measurements within normal limits will improve ?Compliance with prescribed medications will improve ?Ability to identify triggers associated with substance abuse/mental health issues  will improve ? ?Medication Management: Evaluate patient's response, side effects, and tolerance of medication regimen. ? ?Therapeutic Interventions: 1 to 1 sessions, Unit Group sessions and Medication administration. ? ?Evaluation of Outcomes: Not Met ? ?Physician Treatment Plan for Secondary Diagnosis: Principal Problem: ?  Bipolar 2 disorder, major depressive episode ([place]) ?Active Problems: ?  MDD (major depressive disorder) ? ?Long Term Goal(s): Improvement in symptoms so as ready for discharge  ? ?Short Term Goals: Ability to identify changes in lifestyle to reduce recurrence of condition will improve ?Ability to verbalize feelings will improve ?Ability to disclose and discuss suicidal ideas ?Ability to demonstrate self-control will improve ?Ability to identify and develop effective coping behaviors will improve ?Ability to maintain clinical measurements within normal limits will improve ?Compliance with prescribed medications will improve ?Ability to identify triggers associated with substance abuse/mental health issues will improve    ? ?Medication Management: Evaluate patient's response, side effects, and tolerance of medication regimen. ? ?Therapeutic Interventions: 1 to 1 sessions, Unit Group sessions and Medication administration. ? ?Evaluation of Outcomes: Not Met ? ? ?RN Treatment Plan for Primary Diagnosis: Bipolar 2 disorder, major depressive episode ([place]) ?Long Term Goal(s): Knowledge of disease and therapeutic regimen to maintain health will improve ? ?Short Term Goals: Ability to remain free from injury will improve, Ability to verbalize frustration and anger  appropriately will improve, Ability to demonstrate self-control, Ability to participate in decision making will improve, Ability to verbalize feelings will improve, Ability to identify and develop effective copi

## 2021-09-17 NOTE — [facility] Suicide Risk Assessment (Signed)
[facility] INPATIENT:  Family/Significant Other Suicide Prevention Education ? ?Suicide Prevention Education:  ?SPE completed with pt, as pt refused to consent to family contact. SPI pamphlet provided to pt and pt was encouraged to share information with support network, ask questions, and talk about any concerns relating to SPE. Pt denies access to guns/firearms and verbalized understanding of information provided. Mobile Crisis information also provided to pt.  ? ?Patient Refusal for Family/Significant Other Suicide Prevention Education: The patient Jeremiah Taylor. has refused to provide written consent for family/significant other to be provided Family/Significant Other Suicide Prevention Education during admission and/or prior to discharge.  Physician notified. ? ?Madelaine Whipple A [place] ?09/17/2021, 1:06 PM ?

## 2021-09-17 NOTE — Progress Notes (Addendum)
Patient A&Ox4. Independent with ADLS. Involuntary BUE movements d/t Parkinsons. Patient cooperative but can be impulsive at times with pressured/rapid speech. Patient denies SI, HI, A/V/H with no intent/plan. Patient stated having a headache 5 out of 10 and received tylenol po prn. Patient also appeared frustrated throughout the day because he is "ready to go home" and also needs his "teeth fixed." Patient stated having dental pain and called his dentist today in order to make an appointment. Patient received ativan po prn due to anxiety (5 out 10) which appeared to provide some relief. Patient out in day room most of the day and med compliant. No s/s of current distress. No violent/aggressive behavior. Q15 minute safety checks per unit protocol. No further needs verbalized by patient at this time.  ? ? 09/17/21 0815  ?Psych Admission Type (Psych Patients Only)  ?Admission Status Involuntary  ?Psychosocial Assessment  ?Patient Complaints None  ?Eye Contact Fair  ?Facial Expression Other (Comment) ?(appropriate)  ?Affect Appropriate to circumstance  ?Speech Pressured  ?Interaction Assertive  ?Motor Activity Other (Comment) ?(BUE involuntary movements (parkinsons))  ?Appearance/Hygiene In scrubs  ?Behavior Characteristics Cooperative;Appropriate to situation  ?Mood Pleasant  ?Thought Process  ?Coherency WDL  ?Content WDL  ?Delusions None reported or observed  ?Perception WDL  ?Hallucination None reported or observed  ?Judgment Limited  ?Confusion WDL  ?Danger to Self  ?Current suicidal ideation? Denies  ?Danger to Others  ?Danger to Others None reported or observed  ? ? ?

## 2021-09-17 NOTE — H&P (Signed)
Psychiatric Admission Assessment Adult ? ?Patient Identification: Bonne Dolores. ?MRN:  [id number] ?Date of Evaluation:  09/17/2021 ?Chief Complaint:  MDD (major depressive disorder) [F32.9] ?Principal Diagnosis: MDD (major depressive disorder) ?Diagnosis:  Principal Problem: ?  MDD (major depressive disorder) ? ?History of Present Illness: Mr. Callegari is a 61 year old African-American male who was involuntarily admitted to geriatric psychiatry for agitation and history of aggression.  [name] tells me that he recently moved from [place] about a year ago and married his current wife in [place] about 8 months ago.  He states that he and his wife were not getting along.  He denies that he threatened to hurt her.  He states that he is going to go live in his own apartment.  He does have a neurologist in town, Dr. Brigitte Pulse.  He states that he has been taking his Parkinson's medications without any difficulty.  He states that he is doing fine right now.  He denies any auditory or visual hallucinations.  He denies any suicidal or homicidal ideation.  He denies any depression.  His speech is a little bit pressured and he is a little bit irritable but overall cooperative.  He does plan on staying in the [place] area.  He does have a cousin in [place]. ? ?PER INITIAL INTAKE: ?61 yo male who presents based on his wife reports a significant change in behavior.  He saw his neurologist on Thursday and he wanted him to come to the ED for an evaluation.  On assessment, he is pleasant with severe body jerks and states he needs his Parkinson's medicine.  Meanwhile, he reports he moved from [place] to [place] and met his wife.  They have been married 8 months and he is upset that she does not believe in God.  According to his wife, they have known each other for a year.  "I don't have a behavioral problem, I'm just stressed."  Denies suicidal/homicidal ideations, hallucinations, and substance abuse.  He says he is leaving his wife  tomorrow and has a place to go. ?  ?Wife of 8 months, Euclid Cassetta:  She reports his behavior has changed since January and he is hyper-religous, He needs "care around the clock".  "He's just delusional, says God is telling him to do all these things."  "He needs to go some place.  I've been telling him to leave.  There is something wrong with him, he'll tell you something else."  She told the EDP that she is taking domestic violence charges out on him tomorrow so he cannot return home. ?  ?Per his daughter in [place], Lars Mage, states his behavior has changed and he texted his wife   "He throws a temper tantrum when he doesn't get his way."  She reports he is up all night and threatens his wife.  Texted his wife he was going to kill himself and she called his children.  When she talked to him, he told he was going to the mountains and alluded he was going to do something.  His wife has been calling her when he is having these erratic behaviors. ? ?Associated Signs/Symptoms: ?Depression Symptoms:  psychomotor agitation, ?Duration of Depression Symptoms: Greater than two weeks ? ?(Hypo) Manic Symptoms:  Irritable Mood, ?Anxiety Symptoms:   Related to Parkinson's. ?Psychotic Symptoms:   Currently denies. ?PTSD Symptoms: ?NA ?Total Time spent with patient: 1 hour ? ?Past Psychiatric History: Denies ? ?Is the patient at risk to self? No.  ?Has the patient been a  risk to self in the past 6 months? No.  ?Has the patient been a risk to self within the distant past? No.  ?Is the patient a risk to others? No.  ?Has the patient been a risk to others in the past 6 months? No.  ?Has the patient been a risk to others within the distant past? No.  ? ?Prior Inpatient Therapy:   ?Prior Outpatient Therapy:   ? ?Alcohol Screening: 1. How often do you have a drink containing alcohol?: Never ?2. How many drinks containing alcohol do you have on a typical day when you are drinking?: 1 or 2 ?3. How often do you have six or more drinks on one  occasion?: Never ?AUDIT-C Score: 0 ?4. How often during the last year have you found that you were not able to stop drinking once you had started?: Never ?5. How often during the last year have you failed to do what was normally expected from you because of drinking?: Never ?6. How often during the last year have you needed a first drink in the morning to get yourself going after a heavy drinking session?: Never ?7. How often during the last year have you had a feeling of guilt of remorse after drinking?: Never ?8. How often during the last year have you been unable to remember what happened the night before because you had been drinking?: Never ?9. Have you or someone else been injured as a result of your drinking?: No ?10. Has a relative or friend or a doctor or another health worker been concerned about your drinking or suggested you cut down?: No ?Alcohol Use Disorder Identification Test Final Score (AUDIT): 0 ?Substance Abuse History in the last 12 months:  Yes.   ?Consequences of Substance Abuse: ?NA ?Previous Psychotropic Medications: No  ?Psychological Evaluations: No  ?Past Medical History:  ?Past Medical History:  ?Diagnosis Date  ? Anxiety   ? Asthma   ? Hypertension   ? Parkinson disease ([place])   ?  ?Past Surgical History:  ?Procedure Laterality Date  ? CARPAL TUNNEL RELEASE Right   ? x3  ? CHOLECYSTECTOMY    ? OLECRANON BURSECTOMY Right 04/29/2021  ? Procedure: Right olecranon bursa excision;  Surgeon: Hessie Knows, MD;  Location: [facility] ORS;  Service: Orthopedics;  Laterality: Right;  ? REPLACEMENT TOTAL KNEE Right   ? ?Family History:  ?Family History  ?Problem Relation Age of Onset  ? Hypertension Mother   ? ?Family Psychiatric  History: Denied ?Tobacco Screening:   ?Social History:  ?Social History  ? ?Substance and Sexual Activity  ?Alcohol Use Not Currently  ?   ?Social History  ? ?Substance and Sexual Activity  ?Drug Use Not Currently  ?  ?Additional Social History: ?  ?   ?  ?  ?  ?  ?  ?  ?  ?  ?   ?  ? ?Allergies:  No Known Allergies ?Lab Results: No results found for this or any previous visit (from the past 48 hour(s)). ? ?Blood Alcohol level:  ?Lab Results  ?Component Value Date  ? ETH <10 09/14/2021  ? ? ?Metabolic Disorder Labs:  ?No results found for: HGBA1C, MPG ?No results found for: PROLACTIN ?Lab Results  ?Component Value Date  ? CHOL 142 05/26/2021  ? TRIG 90 05/26/2021  ? HDL 39 (L) 05/26/2021  ? CHOLHDL 3.6 05/26/2021  ? VLDL 18 05/26/2021  ? [place] 85 05/26/2021  ? ? ?Current Medications: ?Current Facility-Administered Medications  ?Medication Dose Route  Frequency Provider Last Rate Last Admin  ? acetaminophen (TYLENOL) tablet 650 mg  650 mg Oral Q6H PRN Waldon Merl F, NP      ? albuterol (PROVENTIL) (2.5 MG/3ML) 0.083% nebulizer solution 3 mL  3 mL Inhalation Q6H PRN Sherlon Handing, NP      ? alum & mag hydroxide-simeth (MAALOX/MYLANTA) 200-200-20 MG/5ML suspension 30 mL  30 mL Oral Q4H PRN Waldon Merl F, NP      ? amantadine (SYMMETREL) capsule 100 mg  100 mg Oral Daily Waldon Merl F, NP   100 mg at 09/17/21 2633  ? amLODipine (NORVASC) tablet 2.5 mg  2.5 mg Oral Daily Waldon Merl F, NP   2.5 mg at 09/17/21 3545  ? And  ? benazepril (LOTENSIN) tablet 10 mg  10 mg Oral Daily Waldon Merl F, NP   10 mg at 09/17/21 6256  ? aspirin EC tablet 81 mg  81 mg Oral Daily Sherlon Handing, NP   81 mg at 09/17/21 3893  ? atorvastatin (LIPITOR) tablet 80 mg  80 mg Oral QHS Waldon Merl F, NP   80 mg at 09/16/21 2110  ? budesonide (PULMICORT) nebulizer solution 0.5 mg  0.5 mg Nebulization BID Waldon Merl F, NP   0.5 mg at 09/17/21 0900  ? carbidopa-levodopa (SINEMET IR) 25-100 MG per tablet immediate release 3 tablet  3 tablet Oral QID Sherlon Handing, NP   3 tablet at 09/17/21 7342  ? carvedilol (COREG) tablet 12.5 mg  12.5 mg Oral BID Waldon Merl F, NP   12.5 mg at 09/17/21 8768  ? cholecalciferol (VITAMIN D3) tablet 1,000 Units  1,000 Units Oral Daily  Sherlon Handing, NP   1,000 Units at 09/17/21 1157  ? clonazePAM (KLONOPIN) tablet 0.5 mg  0.5 mg Oral QHS Waldon Merl F, NP   0.5 mg at 09/16/21 2110  ? entacapone (COMTAN) tablet 200 mg  200 mg Oral QI
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
## 2021-09-17 NOTE — Plan of Care (Signed)
?  Problem: Education: ?Goal: Knowledge of [facility] Education information/materials will improve ?Outcome: Progressing ?Goal: Mental status will improve ?Outcome: Progressing ?  ?Problem: Health Behavior/Discharge Planning: ?Goal: Identification of resources available to assist in meeting health care needs will improve ?Outcome: Progressing ?  ?

## 2021-09-17 NOTE — Progress Notes (Signed)
Patient presents A&O x 4. Patient affect is pleasant and calm.  Denies AVH, SI, HI, depression, anxiety or pain.  VSS.  Patient compliant with all scheduled meds.  Patient spent most of evening in day room watching TV.  Observed appropriate interaction with staff and peers. Ongoing Q15 minute safety check rounds per unit protocol. ? ?

## 2021-09-18 DIAGNOSIS — F332 Major depressive disorder, recurrent severe without psychotic features: Secondary | ICD-10-CM | POA: Diagnosis not present

## 2021-09-18 NOTE — Progress Notes (Signed)
The patient was observed interacting with staff and peers while watching a movie. He was compliant with all medications. He was having trouble sleeping during the night, requested seraquel and his breathing medication that he refused at its original due time. The patient does have small involuntary movements due to his parkinsons disease process. Denies SI/HI AVH. He did not complain of any pain during this shift and did not request any pain medications.  ? ?Ivonne Andrew, RN ? ?

## 2021-09-18 NOTE — Care Management Important Message (Signed)
Important Message ? ?Patient Details  ?Name: Jeremiah Taylor. ?MRN: [id number] ?Date of Birth: [birth year] ? ? ?Medicare Important Message Given:  Yes ? ? ? ? ?Brookelle Pellicane A [place], LCSWA ?09/18/2021, 10:35 AM ?

## 2021-09-18 NOTE — Progress Notes (Signed)
?  [facility] Adult Case Management Discharge Plan : ? ?Will you be returning to the same living situation after discharge:  No. ?At discharge, do you have transportation home?: Yes,  CSW will assist pt with obtaining transportation ?Do you have the ability to pay for your medications: Yes,  pt has [employer] Medicare ? ?Release of information consent forms completed and in the chart;  Patient's signature needed at discharge. ? ?Patient to Follow up at: ? Follow-up Information   ? ? [facility] Follow up.   ?Specialty: Urgent Care ?Why: Though you have declined follow up services, here is a resource you can utilize as needed. For medication management, please attend walk-in hours to schedulde your appointment from 7:30am-11am on Monday, Wednesday, Thursday and Friday. For therapy, please attend walk in hours to schedule your appointment Monday and Wednesday 7:30am-11am. Thanks! ?Contact information: ?[address] ?[place] [address] ?[phone] ? ?  ?  ? ? [facility] Follow up.   ?Why: Though you have declined follow up mental health services, here is a resource that you can use if needed. Please attend walk-in hours to make your appointment Monday-Friday 8:30am-3pm. Thanks! ?Contact information: ?[address]  ?[place], [place] [address]  ?Phone: [phone] ? ?  ?  ? ?  ?  ? ?  ? ? ?Next level of care provider has access to [facility] Link:yes ? ?Safety Planning and Suicide Prevention discussed: Yes,  completed with pt ? ?  ? ?Has patient been referred to the Quitline?: Patient refused referral ? ?Patient has been referred for addiction treatment: N/A ? ?Andi Layfield A [place], LCSWA ?09/18/2021, 10:13 AM ?

## 2021-09-18 NOTE — BHH Suicide Risk Assessment (Signed)
[facility] Discharge Suicide Risk Assessment ? ? ?Principal Problem: Bipolar 2 disorder, major depressive episode (HCC) ?Discharge Diagnoses: Principal Problem: ?  Bipolar 2 disorder, major depressive episode (HCC) ?Active Problems: ?  MDD (major depressive disorder) ? ? ?Total Time spent with patient: 1 hour ? ?Musculoskeletal: ?Strength & Muscle Tone: within normal limits ?Gait & Station: normal ?Patient leans: N/A ? ?Psychiatric Specialty Exam ? ?Presentation  ?General Appearance: Appropriate for Environment ? ?Eye Contact:Good ? ?Speech:Clear and Coherent ? ?Speech Volume:Normal ? ?Handedness:No data recorded ? ?Mood and Affect  ?Mood:Irritable; Anxious ? ?Duration of Depression Symptoms: Greater than two weeks ? ?Affect:Congruent ? ? ?Thought Process  ?Thought Processes:-- (delusional) ? ?Descriptions of Associations:No data recorded ?Orientation:Full (Time, Place and Person) ? ?Thought Content:Delusions ? ?History of Schizophrenia/Schizoaffective disorder:No ? ?Duration of Psychotic Symptoms:Greater than six months ? ?Hallucinations:No data recorded ?Ideas of Reference:None ? ?Suicidal Thoughts:No data recorded ?Homicidal Thoughts:No data recorded ? ?Sensorium  ?Memory:Immediate Fair ? ?Judgment:Poor ? ?Insight:Poor ? ? ?Executive Functions  ?Concentration:Poor ? ?Attention Span:Fair ? ?Recall:Fair ? ?Fund of Knowledge:Fair ? ?Language:Fair ? ? ?Psychomotor Activity  ?Psychomotor Activity:No data recorded ? ?Assets  ?Assets:Resilience ? ? ?Sleep  ?Sleep:No data recorded ? ? ?Blood pressure (!) 146/95, pulse 70, temperature 97.8 ?F (36.6 ?C), temperature source Oral, resp. rate 18, height 5\' 11"  (1.803 m), weight 72 kg, SpO2 100 %. Body mass index is 22.14 kg/m?. ? ?Mental Status Per Nursing Assessment::   ?On Admission:  NA ? ?Demographic Factors:  ?Male and NA ? ?Loss Factors: ?Decline in physical health ? ?Historical Factors: ?NA ? ?Risk Reduction Factors:   ?Positive social support and Positive coping skills or  problem solving skills ? ?Continued Clinical Symptoms:  ?Bipolar Disorder:   Bipolar II ? ?Cognitive Features That Contribute To Risk:  ?None   ? ?Suicide Risk:  ?Minimal: No identifiable suicidal ideation.  Patients presenting with no risk factors but with morbid ruminations; may be classified as minimal risk based on the severity of the depressive symptoms ? ? ? ?Plan Of Care/Follow-up recommendations: Neurology ? ? ?[phone], DO ?09/18/2021, 9:36 AM ?

## 2021-09-18 NOTE — BHH Counselor (Signed)
CSW spoke with pt regarding discharge planning. Pt stated that he needed transportation to his car. He also stated that he was not interested in [place] making an appointment for follow up care and declined after care.  ? ?[name], MSW, LCSW-A ?4/6/202310:12 AM  ?

## 2021-09-18 NOTE — Progress Notes (Signed)
BP 132/78 (BP Location: Right Arm)   Pulse 71   Temp 97.8 ?F (36.6 ?C) (Oral)   Resp 18   Ht 5\' 11"  (1.803 m)   Wt 72 kg   SpO2 100%   BMI 22.14 kg/m?  ? ?Jeremiah Taylor&Ox4. Vs stable. Printed AVS reviewed with Jeremiah along with follow up appointments and medications. All valuables/belongings returned to Jeremiah. Jeremiah verbalized understanding. Jeremiah currently denies SI, HI, and Taylor/V/H with no intent or plan. Jeremiah in no current distress and is being transported by . Jeremiah safely walked out by [facility]HT and [facility]. ?

## 2021-09-18 NOTE — Care Management Important Message (Signed)
Important Message ? ?Patient Details  ?Name: Jeremiah Taylor. ?MRN: [id number] ?Date of Birth: 1961-01-30 ? ? ?Medicare Important Message Given:    ? ? ? ? ?Taje Tondreau A [place], LCSWA ?09/18/2021, 10:34 AM ?

## 2021-09-18 NOTE — Discharge Summary (Signed)
Physician Discharge Summary Note ? ?Patient:  Jeremiah Taylor. is an 61 y.o., male ?MRN:  [id number] ?DOB:  January 29, 1961 ?Patient phone:  [phone] (home)  ?Patient address:   ?[address] ?[place] [address],  ?Total Time spent with patient: 1 hour ? ?Date of Admission:  09/16/2021 ?Date of Discharge: 09/17/2021 ? ?Reason for Admission:  [name] is a 61 year old African-American male who was involuntarily admitted to geriatric psychiatry for agitation and history of aggression.  [name] tells me that he recently moved from [place] about a year ago and married his current wife in [place] about 8 months ago.  He states that he and his wife were not getting along.  He denies that he threatened to hurt her.  He states that he is going to go live in his own apartment.  He does have a neurologist in town, Dr. Brigitte Pulse.  He states that he has been taking his Parkinson's medications without any difficulty.  He states that he is doing fine right now.  He denies any auditory or visual hallucinations.  He denies any suicidal or homicidal ideation.  He denies any depression.  His speech is a little bit pressured and he is a little bit irritable but overall cooperative.  He does plan on staying in the [place] area.  He does have a cousin in [place]. ? ?Principal Problem: Bipolar 2 disorder, major depressive episode ([place]) ?Discharge Diagnoses: Principal Problem: ?  Bipolar 2 disorder, major depressive episode ([place]) ?Active Problems: ?  MDD (major depressive disorder) ? ? ?Past Psychiatric History: None except for substance abuse. ? ?Past Medical History:  ?Past Medical History:  ?Diagnosis Date  ? Anxiety   ? Asthma   ? Hypertension   ? Parkinson disease ([place])   ?  ?Past Surgical History:  ?Procedure Laterality Date  ? CARPAL TUNNEL RELEASE Right   ? x3  ? CHOLECYSTECTOMY    ? OLECRANON BURSECTOMY Right 04/29/2021  ? Procedure: Right olecranon bursa excision;  Surgeon: Hessie Knows, MD;  Location: [facility] ORS;  Service:  Orthopedics;  Laterality: Right;  ? REPLACEMENT TOTAL KNEE Right   ? ?Family History:  ?Family History  ?Problem Relation Age of Onset  ? Hypertension Mother   ? ? ?Social History:  ?Social History  ? ?Substance and Sexual Activity  ?Alcohol Use Not Currently  ?   ?Social History  ? ?Substance and Sexual Activity  ?Drug Use Not Currently  ?  ?Social History  ? ?Socioeconomic History  ? Marital status: Married  ?  Spouse name: Not on file  ? Number of children: Not on file  ? Years of education: Not on file  ? Highest education level: Not on file  ?Occupational History  ? Not on file  ?Tobacco Use  ? Smoking status: Former  ?  Types: Cigarettes  ?  Quit date: 07/19/2021  ?  Years since quitting: 0.1  ? Smokeless tobacco: Former  ?Vaping Use  ? Vaping Use: Never used  ?Substance and Sexual Activity  ? Alcohol use: Not Currently  ? Drug use: Not Currently  ? Sexual activity: Not on file  ?Other Topics Concern  ? Not on file  ?Social History Narrative  ? Not on file  ? ?Social Determinants of Health  ? ?Financial Resource Strain: Not on file  ?Food Insecurity: Not on file  ?Transportation Needs: Not on file  ?Physical Activity: Not on file  ?Stress: Not on file  ?Social Connections: Not on file  ? ? ?  Hospital Course: [name] was admitted under routine orders and precautions on an involuntary basis.  He and his wife are getting along and she took out commitment papers on him stating that he was not doing well and he was threatening her.  On admission, he denied that he threatened to hurt her.  He denied depression or suicidal or homicidal ideation.  He denied any auditory or visual hallucinations.  He states that he has bad teeth and he has an appointment with the dentist on Thursday.  While on the unit he was pleasant and cooperative and took his medications as prescribed.  He has a history of Parkinson's disease and was compliant with all of his Parkinson's medications.  He does not have any psychiatric history except  for substance abuse including cocaine and alcohol.  He did not have any withdrawal symptoms while he was on the unit.  He signed voluntarily.  He has a dentist appointment this afternoon and I told him that is fine that he would have to go [facility].  He was okay with that and stated that he had all of his medications.  His ex-wife Delrae Rend called the unit and advocated for him.  She is in [place].  He plans on going to his dentist appointment and getting an apartment and then going to [place] to see his ex-wife and come back to [place].  He has no plans on visiting his current wife that they have arguments and she apparently took out a restraining order.  He denied suicidal or homicidal ideation and discharged [name]. ? ?Physical Findings: ?AIMS:  , ,  ,  ,    ?CIWA:    ?COWS:    ? ?Musculoskeletal: ?Strength & Muscle Tone: within normal limits ?Gait & Station: normal ?Patient leans: N/A ? ? ?Psychiatric Specialty Exam: ? ?Presentation  ?General Appearance: Appropriate for Environment ? ?Eye Contact:Good ? ?Speech:Clear and Coherent ? ?Speech Volume:Normal ? ?Handedness:No data recorded ? ?Mood and Affect  ?Mood:Irritable; Anxious ? ?Affect:Congruent ? ? ?Thought Process  ?Thought Processes:-- (delusional) ? ?Descriptions of Associations:No data recorded ?Orientation:Full (Time, Place and Person) ? ?Thought Content:Delusions ? ?History of Schizophrenia/Schizoaffective disorder:No ? ?Duration of Psychotic Symptoms:Greater than six months ? ?Hallucinations:No data recorded ?Ideas of Reference:None ? ?Suicidal Thoughts:No data recorded ?Homicidal Thoughts:No data recorded ? ?Sensorium  ?Memory:Immediate Fair ? ?Judgment:Poor ? ?Insight:Poor ? ? ?Executive Functions  ?Concentration:Poor ? ?Attention Span:Fair ? ?Recall:Fair ? ?[place] ? ?Language:Fair ? ? ?Psychomotor Activity  ?Psychomotor Activity:No data recorded ? ?Assets  ?Assets:Resilience ? ? ?Sleep  ?Sleep:No data  recorded ? ? ? ?Blood pressure (!) 146/95, pulse 70, temperature 97.8 ?F (36.6 ?C), temperature source Oral, resp. rate 18, height 5\' 11"  (1.803 m), weight 72 kg, SpO2 100 %. Body mass index is 22.14 kg/m?. ? ? ?Social History  ? ?Tobacco Use  ?Smoking Status Former  ? Types: Cigarettes  ? Quit date: 07/19/2021  ? Years since quitting: 0.1  ?Smokeless Tobacco Former  ? ?Tobacco Cessation:  A prescription for an FDA-approved tobacco cessation medication provided at discharge ? ? ?Blood Alcohol level:  ?Lab Results  ?Component Value Date  ? ETH <10 09/14/2021  ? ? ?Metabolic Disorder Labs:  ?No results found for: HGBA1C, MPG ?No results found for: PROLACTIN ?Lab Results  ?Component Value Date  ? CHOL 142 05/26/2021  ? TRIG 90 05/26/2021  ? HDL 39 (L) 05/26/2021  ? CHOLHDL 3.6 05/26/2021  ? VLDL 18 05/26/2021  ? [place] 85 05/26/2021  ? ? ?  See Psychiatric Specialty Exam and Suicide Risk Assessment completed by Attending Physician prior to discharge. ? ?Discharge destination:  Home ? ?Is patient on multiple antipsychotic therapies at discharge:  No   ?Has Patient had three or more failed trials of antipsychotic monotherapy by history:  No ? ?Recommended Plan for Multiple Antipsychotic Therapies: ?NA ? ? ?Allergies as of 09/18/2021   ?No Known Allergies ?  ? ?  ?Medication List  ?  ? ?TAKE these medications   ? ?  Indication  ?albuterol 108 (90 Base) MCG/ACT inhaler ?Commonly known as: VENTOLIN HFA ?Inhale 1-2 puffs into the lungs every 6 (six) hours as needed for wheezing or shortness of breath. ?   ?Amantadine HCl 100 MG tablet ?Take 100 mg by mouth daily. ?   ?Amantadine HCl 100 MG tablet ?Take 1 tablet (100 mg total) by mouth daily. ?   ?amLODipine-benazepril 10-40 MG capsule ?Commonly known as: LOTREL ?Take 1 capsule by mouth daily. ?   ?aspirin 81 MG EC tablet ?Take 1 tablet (81 mg total) by mouth daily. Swallow whole. ?   ?atorvastatin 80 MG tablet ?Commonly known as: LIPITOR ?Take 80 mg by mouth at bedtime. ?    ?budesonide 0.5 MG/2ML nebulizer solution ?Commonly known as: PULMICORT ?Take 0.5 mg by nebulization in the morning and at bedtime. ?   ?carbidopa-levodopa 25-100 MG tablet ?Commonly known as: SINEMET IR ?Take

## 2021-09-18 NOTE — BHH Group Notes (Addendum)
BHH Group Notes:  (Nursing/MHT/Case Management/Adjunct) ? ?Date:  09/17/2021  ?Time:  10:00 AM ? ?Type of Therapy:  Psychoeducational Skills ? ?Participation Level:  Active ? ?Participation Quality:  Appropriate and Attentive ? ?Affect:  Appropriate ? ?Cognitive:  Appropriate ? ?Insight:  Appropriate ? ?Engagement in Group:  Engaged ? ?Modes of Intervention:  Discussion ? ?Summary of Progress/Problems: ?The pt attended group and participated in the activity, as well as shared during discussion. ?Barbaraann Rondo ?09/18/2021, 10:57 AM ?

## 2021-09-18 NOTE — BHH Counselor (Signed)
CSW spoke with pt's mental health advocate and ex-fiance Helmut Muster Wright,[phone] to provide collateral information.  ? ?She stated that pt does have a dentist appointment, is trying to make sure his life insurance plan doesn't lapse and needs to put down the deposit on his new apartment.  ? ?She stated his current wife IVC'd him out of "spite" and that yes he has always been very "spiritual" he is able to care for himself.  ? ?No other requests were made. Conversation ended without incident.  ? ?Severus Brodzinski [place], MSW, LCSW-A ?4/6/20239:49 AM  ?

## 2021-09-23 ENCOUNTER — Other Ambulatory Visit: Payer: Self-pay

## 2021-09-23 ENCOUNTER — Emergency Department: Payer: Medicare HMO

## 2021-09-23 ENCOUNTER — Encounter: Payer: Self-pay | Admitting: Internal Medicine

## 2021-09-23 ENCOUNTER — Observation Stay
Admission: EM | Admit: 2021-09-23 | Discharge: 2021-09-26 | Disposition: A | Discharge status: Home / Self Care | Payer: Medicare HMO | Attending: Internal Medicine | Admitting: Internal Medicine

## 2021-09-23 DIAGNOSIS — Z7984 Long term (current) use of oral hypoglycemic drugs: Secondary | ICD-10-CM | POA: Diagnosis not present

## 2021-09-23 DIAGNOSIS — Z87891 Personal history of nicotine dependence: Secondary | ICD-10-CM | POA: Insufficient documentation

## 2021-09-23 DIAGNOSIS — Z8679 Personal history of other diseases of the circulatory system: Secondary | ICD-10-CM | POA: Diagnosis not present

## 2021-09-23 DIAGNOSIS — G20A1 Parkinson's disease without dyskinesia, without mention of fluctuations: Secondary | ICD-10-CM | POA: Diagnosis present

## 2021-09-23 DIAGNOSIS — I10 Essential (primary) hypertension: Secondary | ICD-10-CM | POA: Diagnosis not present

## 2021-09-23 DIAGNOSIS — G20 Parkinson's disease: Secondary | ICD-10-CM | POA: Insufficient documentation

## 2021-09-23 DIAGNOSIS — F419 Anxiety disorder, unspecified: Secondary | ICD-10-CM | POA: Diagnosis present

## 2021-09-23 DIAGNOSIS — Z7982 Long term (current) use of aspirin: Secondary | ICD-10-CM | POA: Diagnosis not present

## 2021-09-23 DIAGNOSIS — R55 Syncope and collapse: Secondary | ICD-10-CM | POA: Diagnosis not present

## 2021-09-23 DIAGNOSIS — G2401 Drug induced subacute dyskinesia: Secondary | ICD-10-CM | POA: Insufficient documentation

## 2021-09-23 DIAGNOSIS — J449 Chronic obstructive pulmonary disease, unspecified: Secondary | ICD-10-CM | POA: Diagnosis not present

## 2021-09-23 DIAGNOSIS — E119 Type 2 diabetes mellitus without complications: Secondary | ICD-10-CM | POA: Insufficient documentation

## 2021-09-23 DIAGNOSIS — J45909 Unspecified asthma, uncomplicated: Secondary | ICD-10-CM | POA: Insufficient documentation

## 2021-09-23 DIAGNOSIS — Z7901 Long term (current) use of anticoagulants: Secondary | ICD-10-CM | POA: Insufficient documentation

## 2021-09-23 LAB — BASIC METABOLIC PANEL
Anion gap: 8 (ref 5–15)
BUN: 20 mg/dL (ref 8–23)
CO2: 25 mmol/L (ref 22–32)
Calcium: 8.9 mg/dL (ref 8.9–10.3)
Chloride: 104 mmol/L (ref 98–111)
Creatinine, Ser: 1.08 mg/dL (ref 0.61–1.24)
GFR, Estimated: >60 mL/min (ref >60)
Glucose, Bld: 130 mg/dL — ABNORMAL HIGH (ref 70–99)
Potassium: 4 mmol/L (ref 3.5–5.1)
Sodium: 137 mmol/L (ref 135–145)

## 2021-09-23 LAB — CBC
HCT: 43 % (ref 39.0–52.0)
Hemoglobin: 14.3 g/dL (ref 13.0–17.0)
MCH: 31.4 pg (ref 26.0–34.0)
MCHC: 33.3 g/dL (ref 30.0–36.0)
MCV: 94.5 fL (ref 80.0–100.0)
Platelets: 286 10*3/uL (ref 150–400)
RBC: 4.55 MIL/uL (ref 4.22–5.81)
RDW: 13.1 % (ref 11.5–15.5)
WBC: 9 10*3/uL (ref 4.0–10.5)
nRBC: 0 % (ref 0.0–0.2)

## 2021-09-23 LAB — TROPONIN I (HIGH SENSITIVITY): Troponin I (High Sensitivity): 5 ng/L (ref <18)

## 2021-09-23 LAB — CBG MONITORING, ED: Glucose-Capillary: 128 mg/dL — ABNORMAL HIGH (ref 70–99)

## 2021-09-23 MED ORDER — CARBIDOPA-LEVODOPA 25-100 MG PO TABS
3.0000 | ORAL_TABLET | Freq: Four times a day (QID) | ORAL | Status: DC
  Administered 2021-09-23 – 2021-09-24 (×5): 3 via ORAL
  Filled 2021-09-23 (×6): qty 3

## 2021-09-23 MED ORDER — CLONAZEPAM 0.5 MG PO TABS
0.5000 mg | ORAL_TABLET | Freq: Two times a day (BID) | ORAL | Status: DC | PRN
  Administered 2021-09-23 – 2021-09-26 (×3): 0.5 mg via ORAL
  Filled 2021-09-23 (×3): qty 1

## 2021-09-23 MED ORDER — CLONAZEPAM 0.5 MG PO TABS: 0.5000 mg | ORAL_TABLET | ORAL | Status: DC

## 2021-09-23 MED ORDER — LORAZEPAM 2 MG/ML IJ SOLN
1.0000 mg | Freq: Once | INTRAMUSCULAR | Status: AC
  Administered 2021-09-23: 1 mg via INTRAVENOUS
  Filled 2021-09-23: qty 1

## 2021-09-23 MED ORDER — ALBUTEROL SULFATE (2.5 MG/3ML) 0.083% IN NEBU: 2.5000 mL | INHALATION_SOLUTION | Freq: Four times a day (QID) | RESPIRATORY_TRACT | Status: DC | PRN

## 2021-09-23 MED ORDER — ESCITALOPRAM OXALATE 10 MG PO TABS
10.0000 mg | ORAL_TABLET | Freq: Every day | ORAL | Status: DC
  Administered 2021-09-24 – 2021-09-26 (×3): 10 mg via ORAL
  Filled 2021-09-23 (×3): qty 1

## 2021-09-23 MED ORDER — ENTACAPONE 200 MG PO TABS
200.0000 mg | ORAL_TABLET | Freq: Every day | ORAL | Status: DC
Start: 2021-09-24 — End: 2021-09-24
  Administered 2021-09-24: 200 mg via ORAL
  Filled 2021-09-23: qty 1

## 2021-09-23 MED ORDER — MOMETASONE FURO-FORMOTEROL FUM 200-5 MCG/ACT IN AERO
2.0000 | INHALATION_SPRAY | Freq: Two times a day (BID) | RESPIRATORY_TRACT | Status: DC
  Administered 2021-09-23 – 2021-09-26 (×6): 2 via RESPIRATORY_TRACT
  Filled 2021-09-23: qty 8.8

## 2021-09-23 MED ORDER — ACETAMINOPHEN 650 MG RE SUPP: 650.0000 mg | Freq: Four times a day (QID) | RECTAL | Status: DC | PRN

## 2021-09-23 MED ORDER — ACETAMINOPHEN 325 MG PO TABS
650.0000 mg | ORAL_TABLET | Freq: Four times a day (QID) | ORAL | Status: DC | PRN
Start: 2021-09-23 — End: 2021-09-26
  Administered 2021-09-24 – 2021-09-25 (×2): 650 mg via ORAL
  Filled 2021-09-23 (×2): qty 2

## 2021-09-23 MED ORDER — SODIUM CHLORIDE 0.9% FLUSH
3.0000 mL | Freq: Two times a day (BID) | INTRAVENOUS | Status: DC
  Administered 2021-09-23 – 2021-09-26 (×6): 3 mL via INTRAVENOUS

## 2021-09-23 MED ORDER — AMANTADINE HCL 100 MG PO CAPS: 100.0000 mg | ORAL_CAPSULE | Freq: Every day | ORAL | Status: DC

## 2021-09-23 MED ORDER — GABAPENTIN 100 MG PO CAPS
100.0000 mg | ORAL_CAPSULE | Freq: Two times a day (BID) | ORAL | Status: DC
  Administered 2021-09-23 – 2021-09-26 (×6): 100 mg via ORAL
  Filled 2021-09-23 (×6): qty 1

## 2021-09-23 MED ORDER — ONDANSETRON HCL 4 MG PO TABS: 4.0000 mg | ORAL_TABLET | Freq: Four times a day (QID) | ORAL | Status: DC | PRN

## 2021-09-23 MED ORDER — ONDANSETRON HCL 4 MG/2ML IJ SOLN: 4.0000 mg | Freq: Four times a day (QID) | INTRAMUSCULAR | Status: DC | PRN

## 2021-09-23 MED ORDER — ASPIRIN EC 81 MG PO TBEC
81.0000 mg | DELAYED_RELEASE_TABLET | Freq: Every day | ORAL | Status: DC
  Administered 2021-09-23 – 2021-09-26 (×4): 81 mg via ORAL
  Filled 2021-09-23 (×4): qty 1

## 2021-09-23 MED ORDER — ATORVASTATIN CALCIUM 20 MG PO TABS
40.0000 mg | ORAL_TABLET | Freq: Every day | ORAL | Status: DC
  Administered 2021-09-23 – 2021-09-25 (×3): 40 mg via ORAL
  Filled 2021-09-23 (×3): qty 2

## 2021-09-23 MED ORDER — CLONAZEPAM 0.5 MG PO TABS
0.5000 mg | ORAL_TABLET | Freq: Every day | ORAL | Status: DC
  Administered 2021-09-23 – 2021-09-25 (×3): 0.5 mg via ORAL
  Filled 2021-09-23 (×3): qty 1

## 2021-09-23 MED ORDER — LORATADINE 10 MG PO TABS
10.0000 mg | ORAL_TABLET | Freq: Every day | ORAL | Status: DC
  Administered 2021-09-24 – 2021-09-26 (×3): 10 mg via ORAL
  Filled 2021-09-23 (×3): qty 1

## 2021-09-23 MED ORDER — VITAMIN D 25 MCG (1000 UNIT) PO TABS
1000.0000 [IU] | ORAL_TABLET | Freq: Every day | ORAL | Status: DC
  Administered 2021-09-24 – 2021-09-26 (×3): 1000 [IU] via ORAL
  Filled 2021-09-23 (×3): qty 1

## 2021-09-23 MED ORDER — SODIUM CHLORIDE 0.9 % IV BOLUS
500.0000 mL | Freq: Once | INTRAVENOUS | Status: AC
  Administered 2021-09-23: 500 mL via INTRAVENOUS

## 2021-09-23 NOTE — ED Notes (Signed)
Pt given gingerale and crackers at this time. Will message Admit MD regarding pt's dinner order and any medications for his shaking. ?

## 2021-09-23 NOTE — ED Notes (Signed)
Pt was placed on a hospital gown and cardiac monitor.  ?

## 2021-09-23 NOTE — ED Notes (Signed)
Pt given urinal.

## 2021-09-23 NOTE — ED Provider Notes (Signed)
? ?[facility] ?Provider Note ? ? ? Event Date/Time  ? First MD Initiated Contact with Patient 09/23/21 1356   ?  (approximate) ? ? ?History  ? ?Near Syncope ? ? ?HPI ? ?[name]. is a 61 y.o. male who on review of discharge summary from April 6 of this year was admitted for bipolar disorder and major depression.  He also has a history of asthma hypertension and Parkinson's disease. .  Further review of medical record indicates patient also has a history of COPD 6 type 2 diabetes atypical chest pain coronary artery disease ?  ?Patient was at [employer] this morning, reports while they are holding a cup of coffee he suddenly felt weak lightheaded and then passed out.  Coffee did not spill on him.  He reports he was able to get himself up get in his car and drove to the ER.  The ER he was getting out of his car stood up and passed out again.  Awoke quickly and noted that he felt like he had struck his head sore over the back.  Again feeling like he passed out. ? ?Denies recent illness but was at the hospital admitted for Parkinson's and psychiatric symptoms recently.  Denies new medication change.  No recent illness.  No nausea or vomiting.  No headache except for mild headache at this time.  No chest pain no trouble breathing.  Reports severe Parkinson's disease with difficulty controlling his tremors which has been ongoing now for months but seemingly worse last month ? ?Took his amantadine and Sinemet about 40 minutes before he arrived to the ED today ? ?Physical Exam  ? ?Triage Vital Signs: ?ED Triage Vitals [09/23/21 1339]  ?Enc Vitals Group  ?   BP 113/63  ?   Pulse Rate 76  ?   Resp (!) 22  ?   Temp 97.8 ?F (36.6 ?C)  ?   Temp Source Oral  ?   SpO2 93 %  ?   Weight 158 lb 11.7 oz (72 kg)  ?   Height 5\' 11"  (1.803 m)  ?   Head Circumference   ?   Peak Flow   ?   Pain Score 0  ?   Pain Loc   ?   Pain Edu?   ?   Excl. in GC?   ? ? ?Most recent vital signs: ?Vitals:  ? 09/23/21 1339   ?BP: 113/63  ?Pulse: 76  ?Resp: (!) 22  ?Temp: 97.8 ?F (36.6 ?C)  ?SpO2: 93%  ? ? ? ?General: Awake, no distress.  Notable dyskinesias.  Moves all extremities with a somewhat jerky like movement pattern and pill-rolling tremor in the upper extremities bilateral.  Moves all extremities equally with 5 out of 5 strength.  Slightly Parkinson appearance of the face ?CV:  Good peripheral perfusion.  Normal heart tones and rate ?Resp:  Normal effort.  Clear lungs bilaterally.  Normal work of breathing ?Abd:  No distention.  ?Other:  Clear speech. ? ? ?ED Results / Procedures / Treatments  ? ?Labs ?(all labs ordered are listed, but only abnormal results are displayed) ?Labs Reviewed  ?BASIC METABOLIC PANEL - Abnormal; Notable for the following components:  ?    Result Value  ? Glucose, Bld 130 (*)   ? All other components within normal limits  ?CBC  ?TROPONIN I (HIGH SENSITIVITY)  ? ? ? ?EKG ? ?Is reviewed and interpreted by me at 1337 ?Heart rate 75 ?QRS  80 ?QTc 410 ?Normal sinus rhythm, some artifact, mild nonspecific T wave abnormality including slight inverted appearance in V4 and V5.  No evidence of acute STEMI  EKG is similar appearance to September 14, 2021, but T wave inversions in the lateral precordial leads are now noted ? ? ?RADIOLOGY ? ? ?CT head reviewed, personally interpreted by me is grossly negative for acute pathology ? ?CT Head Wo Contrast ? ?Result Date: 09/23/2021 ?CLINICAL DATA:  Head trauma, intracranial venous injury suspected EXAM: CT HEAD WITHOUT CONTRAST TECHNIQUE: Contiguous axial images were obtained from the base of the skull through the vertex without intravenous contrast. RADIATION DOSE REDUCTION: This exam was performed according to the departmental dose-optimization program which includes automated exposure control, adjustment of the mA and/or kV according to patient size and/or use of iterative reconstruction technique. COMPARISON:  None. FINDINGS: Mildly motion limited study.  Within this  limitation: Brain: No evidence of acute infarction, hemorrhage, hydrocephalus, extra-axial collection or mass lesion/mass effect. Partially empty sella. Vascular: No hyperdense vessel identified. Calcific intracranial atherosclerosis. Skull: No acute fracture. Sinuses/Orbits: Clear visualized sinuses. No acute orbital findings. Other: Trace right mastoid effusion. IMPRESSION: Mildly motion limited study without evidence of acute intracranial abnormality. Electronically Signed   By: Feliberto Harts M.D.   On: 09/23/2021 14:45   ? ? ? ? ? ?PROCEDURES: ? ?Critical Care performed: No ? ?Procedures ? ? ?MEDICATIONS ORDERED IN ED: ?Medications  ?sodium chloride 0.9 % bolus 500 mL (500 mLs Intravenous New Bag/Given 09/23/21 1424)  ?LORazepam (ATIVAN) injection 1 mg (1 mg Intravenous Given 09/23/21 1425)  ? ? ? ?IMPRESSION / MDM / ASSESSMENT AND PLAN / ED COURSE  ?I reviewed the triage vital signs and the nursing notes. ?             ?               ? ?Differential diagnosis includes, but is not limited to, multiple syncope.  Etiology unclear could be representative of dehydration, electrolyte abnormality, palpitation, cardiac event or arrhythmia.  Denies chest pain.  No shortness of breath no hypoxia.  Normal work of breathing.  Doubt etiology such as pulmonary embolism given asymptomatic pulmonary symptoms and reassuring exam.  Also entertain other causes such as electrolyte anemia etc.  Based on the fact he reports 2 episodes of syncope suspect will be prudent to observe the patient overnight at a minimum, and further work-up pending including labs at this time.  Patient to remain on telemetry monitoring fall precautions. ? ?Regarding patient's dyskinesias, he had recent visit in February with neurology where he was noted to have Parkinson's with severe dyskinesia.  Patient reports that for about a month now has been experiencing out-of-control Parkinson's symptoms and severe movement disorder which is also noted today.   Patient did not mention an ER visit yesterday, but records review show that he was evaluated at [facility] for "Parkinson's flare" which apparently responded well to benzodiazepine ? ?Pretest probability for intracranial hemorrhage is low, no clear evidence of trauma on exam but he does report mild headache and that he did strike his head on the car door when he had a syncopal episode arriving in the ED.  CT head ordered to evaluate and exclude hemorrhage ? ?The patient is on the cardiac monitor to evaluate for evidence of arrhythmia and/or significant heart rate changes. ? ?Patient's labs reviewed CBC troponin and metabolic panel are all normal. ? ?Clinical Course as of 09/23/21 1509  ?Tue Sep 23, 2021  ?  [id number] Spoke with Dr. Selina CooleyStack of neurology regarding the patient's severe dyskinesias.  She advises will see in consult, likely tomorrow.  This seems appropriate given the chronicity but I think the patient would benefit from recommendations from neurology regarding the severity of his dyskinesia [MQ]  ?1442 Patient currently resting comfortably.  Dyskinesias have improved.  He is in no distress, suspect Ativan has been quite helpful.  Awaiting results of CT head [MQ]  ?  ?Clinical Course User Index ?[MQ] Sharyn CreamerQuale, Jamesyn Moorefield, MD  ? ?----------------------------------------- ?3:07 PM on 09/23/2021 ?----------------------------------------- ?Work-up for syncope to this point reassuring.  Given the patient's clinical history vasovagal is a certainly a potential consideration given his known Parkinson's.  However given 2 discrete episodes of syncope today will admit the patient for observation and further evaluation.  Also neurology will provide consultation regarding severe dyskinesias. ? ?Consult discussed and accepted by Dr. Joylene IgoAgbata ? ?FINAL CLINICAL IMPRESSION(S) / ED DIAGNOSES  ? ?Final diagnoses:  ?Syncope and collapse  ? ? ? ?Rx / DC Orders  ? ?ED Discharge Orders   ? ? None  ? ?  ? ? ? ?Note:  This document was prepared  using Dragon voice recognition software and may include unintentional dictation errors. ?  Sharyn Creamer?Apollonia Amini, MD ?09/23/21 1509 ? ?

## 2021-09-23 NOTE — Assessment & Plan Note (Signed)
Blood pressure is stable 

## 2021-09-23 NOTE — H&P (Signed)
?History and Physical  ? ? ?Patient: Jeremiah SciaraJohnnie Werntz Jr. [id number] DOB: October 06, 1960 ?DOA: 09/23/2021 ?DOS: the patient was seen and examined on 09/23/2021 ?PCP: Enid BaasKalisetti, Radhika, MD  ?Patient coming from: Home ? ?Chief Complaint:  ?Chief Complaint  ?Patient presents with  ? Near Syncope  ? ?HPI: Jeremiah SciaraJohnnie Winnett Jr. is a 61 y.o. male with medical history significant for Parkinson's disease, hypertension, anxiety disorder who presents to the emergency room for evaluation " after he fell out twice". ?Patient states that he has been feeling dizzy and lightheaded for the last 24 hours.  He had gone to [employer] to get something to eat and while there he states that he passed out.  When he regained consciousness he drove to the emergency room and while trying to get out of his vehicle he passed out again falling in the emergency room.  He was assisted into a wheelchair and brought into the ER for further evaluation. ?He complains of feeling dizzy and lightheaded while standing.  He also complains of poor oral intake. ?He denies having any chest pain, no shortness of breath, no nausea, no vomiting, no diarrhea, no leg swelling, no palpitations, no diaphoresis, no headache, no blurred vision no focal deficits. ?His wife states that she had to put him out of the house due to mental status changes which she describes as being very erratic and having poor judgment.  She also states that he has been threatening her.  Patient appears to be homeless at this time and probably living out of his car.  Wife states that she [place] [facility] [place] [facility] [place] [place] and was informed that her husband was there and was complaining of chest pain but refused to go to the hospital for evaluation.  She is concerned about his behavior and is requesting psychiatric evaluation while he is in the hospital. ?[facility] after treatment for bipolar 2 disorder, major  depressive episode. ?Review of Systems: As mentioned in the history of present illness. All other systems reviewed and are negative. ?Past Medical History:  ?Diagnosis Date  ? Anxiety   ? Asthma   ? Hypertension   ? Parkinson disease (HCC)   ? ?Past Surgical History:  ?Procedure Laterality Date  ? CARPAL TUNNEL RELEASE Right   ? x3  ? CHOLECYSTECTOMY    ? OLECRANON BURSECTOMY Right 04/29/2021  ? Procedure: Right olecranon bursa excision;  Surgeon: Kennedy BuckerMenz, Michael, MD;  Location: [facility] ORS;  Service: Orthopedics;  Laterality: Right;  ? REPLACEMENT TOTAL KNEE Right   ? ?Social History:  reports that he quit smoking about 2 months ago. His smoking use included cigarettes. He has quit using smokeless tobacco. He reports that he does not currently use alcohol. He reports that he does not currently use drugs. ? ?No Known Allergies ? ?Family History  ?Problem Relation Age of Onset  ? Hypertension Mother   ? ? ?Prior to Admission medications   ?Medication Sig Start Date End Date Taking? Authorizing Provider  ?albuterol (VENTOLIN HFA) 108 (90 Base) MCG/ACT inhaler Inhale 1-2 puffs into the lungs every 6 (six) hours as needed for wheezing or shortness of breath.   Yes [provider]  ?Amantadine HCl 100 MG tablet Take 1 tablet (100 mg total) by mouth daily. 08/10/21 09/23/21 Yes Gilles ChiquitoSmith, Zachary P, MD  ?amLODipine-benazepril (LOTREL) 10-40 MG capsule Take 1 capsule by mouth daily. 01/07/21  Yes [provider]  ?aspirin EC  81 MG EC tablet Take 1 tablet (81 mg total) by mouth daily. Swallow whole. 05/26/21  Yes Pokhrel, Laxman, MD  ?atorvastatin (LIPITOR) 80 MG tablet Take 40 mg by mouth at bedtime. 04/24/21  Yes [provider]  ?budesonide (PULMICORT) 0.5 MG/2ML nebulizer solution Take 0.5 mg by nebulization in the morning and at bedtime. 05/23/21  Yes [provider]  ?carbidopa-levodopa (SINEMET IR) 25-100 MG tablet Take 3 tablets by mouth 4 (four) times daily.   Yes [provider]   ?carvedilol (COREG) 12.5 MG tablet Take 12.5 mg by mouth 2 (two) times daily. 05/06/21  Yes [provider]  ?cetirizine (ZYRTEC) 10 MG tablet Take 10 mg by mouth daily. 07/10/21  Yes [provider]  ?cholecalciferol (VITAMIN D3) 25 MCG (1000 UNIT) tablet Take 1,000 Units by mouth daily.   Yes [provider]  ?clonazePAM (KLONOPIN) 0.5 MG tablet Take 0.5 mg by mouth See admin instructions. Take 0.5mg  twice daily as needed for tremors and 0.5mg  at bedtime for sleep. 01/14/21  Yes [provider]  ?entacapone (COMTAN) 200 MG tablet Take 200 mg by mouth daily. 07/18/21  Yes [provider]  ?escitalopram (LEXAPRO) 10 MG tablet Take 10 mg by mouth daily. 04/04/21  Yes [provider]  ?fluticasone (FLONASE) 50 MCG/ACT nasal spray Place 1 spray into the nose 2 (two) times daily as needed for allergies. 12/13/20 12/13/21 Yes [provider]  ?gabapentin (NEURONTIN) 100 MG capsule Take 1 capsule (100 mg total) by mouth 3 (three) times daily. ?Patient taking differently: Take 100 mg by mouth 2 (two) times daily. 08/10/21 09/23/21 Yes Gilles Chiquito, MD  ?[place] 160-4.5 MCG/ACT inhaler Inhale 2 puffs into the lungs See admin instructions. Twice daily every other day 01/07/21  Yes [provider]  ?Dwyane Luo 200-62.5-25 MCG/ACT AEPB Inhale 1 puff into the lungs every other day. 09/12/21  Yes [provider]  ? ? ?Physical Exam: ?Vitals:  ? 09/23/21 1339  ?BP: 113/63  ?Pulse: 76  ?Resp: (!) 22  ?Temp: 97.8 ?F (36.6 ?C)  ?TempSrc: Oral  ?SpO2: 93%  ?Weight: 72 kg  ?Height: 5\' 11"  (1.803 m)  ? ?Physical Exam ?Vitals and nursing note reviewed.  ?Constitutional:   ?   Appearance: Normal appearance.  ?   Comments: Noted to have severe dyskinesia  ?HENT:  ?   Head: Normocephalic and atraumatic.  ?   Nose: Nose normal.  ?   Mouth/Throat:  ?   Mouth: Mucous membranes are moist.  ?Eyes:  ?   Pupils: Pupils are equal, round, and reactive to light.   ?Cardiovascular:  ?   Rate and Rhythm: Normal rate and regular rhythm.  ?Pulmonary:  ?   Effort: Pulmonary effort is normal.  ?   Breath sounds: Normal breath sounds.  ?Abdominal:  ?   General: Abdomen is flat. Bowel sounds are normal.  ?   Palpations: Abdomen is soft.  ?Musculoskeletal:     ?   General: Normal range of motion.  ?   Cervical back: Normal range of motion and neck supple.  ?Skin: ?   General: Skin is warm and dry.  ?Neurological:  ?   Mental Status: He is alert and oriented to person, place, and time.  ?   Coordination: Coordination abnormal.  ?Psychiatric:     ?   Mood and Affect: Mood normal.     ?   Behavior: Behavior normal.  ? ? ?Data Reviewed: ?Relevant notes from primary care and specialist  visits, past discharge summaries as available in EHR, including Care Everywhere. ?Prior diagnostic testing as pertinent to current admission diagnoses ?Updated medications and problem lists for reconciliation ?ED course, including vitals, labs, imaging, treatment and response to treatment ?Triage notes, nursing and pharmacy notes and ED provider's notes ?Notable results as noted in HPI ?Labs reviewed.  Sodium 137, potassium 4.0, chloride 104, bicarb 25, glucose 130, BUN 20, creatinine 1.08, troponin 5, white count 9.7, hemoglobin 14.3, hematocrit 43.0, platelet count 286 ?CT scan of the head without contrast shows no evidence of intracranial abnormality ?Twelve-lead EKG reviewed by me shows sinus rhythm with PVCs and T wave inversions in the inferior lateral leads. ?There are no new results to review at this time. ? ?Assessment and Plan: ?* Syncope and collapse ?Most likely secondary to autonomic dysfunction from known Parkinson's disease ?Orthostatic blood pressure checks ?Place patient on cardiac monitor to rule out arrhythmia ?We will obtain 2D echocardiogram to assess LVEF and rule out aortic stenosis ?Consult cardiology ? ?Parkinson's disease (HCC) ?Patient noted to have advanced Parkinson's disease  with severe dyskinesia ?Chart review shows that patient has worsening dyskinesia ?Seen by his neurologist in February, 2023 who recommended that patient start Comtan 200 mg 4 x daily, taken with his Sinemet  ?He al

## 2021-09-23 NOTE — Plan of Care (Signed)
?  Problem: Education: ?Goal: Knowledge of General Education information will improve ?Description: Including pain rating scale, medication(s)/side effects and non-pharmacologic comfort measures ?Outcome: Progressing ?  ?Problem: Health Behavior/Discharge Planning: ?Goal: Ability to manage health-related needs will improve ?Outcome: Progressing ?  ?Problem: Clinical Measurements: ?Goal: Ability to maintain clinical measurements within normal limits will improve ?Outcome: Progressing ?Goal: Will remain free from infection ?Outcome: Progressing ?Goal: Diagnostic test results will improve ?Outcome: Progressing ?Goal: Respiratory complications will improve ?Outcome: Progressing ?Goal: Cardiovascular complication will be avoided ?Outcome: Progressing ?  ?Problem: Activity: ?Goal: Risk for activity intolerance will decrease ?Outcome: Progressing ?  ?Problem: Nutrition: ?Goal: Adequate nutrition will be maintained ?Outcome: Progressing ?  ?Problem: Coping: ?Goal: Level of anxiety will decrease ?Outcome: Progressing ?  ?Problem: Elimination: ?Goal: Will not experience complications related to bowel motility ?Outcome: Progressing ?Goal: Will not experience complications related to urinary retention ?Outcome: Progressing ?  ?Problem: Pain Managment: ?Goal: General experience of comfort will improve ?Outcome: Progressing ?  ?Problem: Safety: ?Goal: Ability to remain free from injury will improve ?Outcome: Progressing ?  ?Problem: Education: ?Goal: Knowledge of condition and prescribed therapy will improve ?Outcome: Progressing ?  ?Problem: Skin Integrity: ?Goal: Risk for impaired skin integrity will decrease ?Outcome: Progressing ?  ?Problem: Cardiac: ?Goal: Will achieve and/or maintain adequate cardiac output ?Outcome: Progressing ?  ?Problem: Physical Regulation: ?Goal: Complications related to the disease process, condition or treatment will be avoided or minimized ?Outcome: Progressing ?  ?

## 2021-09-23 NOTE — ED Notes (Signed)
Pt eating dinner at this time

## 2021-09-23 NOTE — Consult Note (Signed)
[facility] Face-to-Face Psychiatry Consult  ? ?Reason for Consult:  "Erratic behavior" ?Referring Physician:  Agbata ?Patient Identification: Jeremiah Taylor. ?MRN:  [id number] ?Principal Diagnosis: Parkinson's disease (HCC) ?Diagnosis:  Principal Problem: ?  Parkinson's disease (HCC) ?Active Problems: ?  HTN (hypertension) ?  Anxiety ?  Syncope and collapse ? ? ?Total Time spent with patient: 30 minutes ? ?Subjective:   ?Jeremiah Taylor. is a 61 y.o. male patient admitted with "passing out."  ? ?HPI:  Patient was admitted to the geriatric psychiatry unit at this facility 09/16/21-09/17/21 for agitation and aggression. The context included significant discord with his wife, who has since filed for separation. He does not have a history of psychiatric diagnoses. He denied suicidal or homicidal ideations or depression at the time of his hospitalization.  ? ?Patient denies suicidal or homicidal thoughts. Is calm and cooperative. No paranoia. Denies auditory or visual hallucinations. Patient states his concern is finding housing. He does not desire or have need of inpatient psychiatric hospitalization. Writer discussed with Dr. Marlou Porch and Dr. Joylene Igo.  ? ?Past Psychiatric History: hospitalized for one day 4/4-09/17/21 ? ?Risk to Self:   ?Risk to Others:   ?Prior Inpatient Therapy:   ?Prior Outpatient Therapy:   ? ?Past Medical History:  ?Past Medical History:  ?Diagnosis Date  ? Anxiety   ? Asthma   ? Hypertension   ? Parkinson disease (HCC)   ?  ?Past Surgical History:  ?Procedure Laterality Date  ? CARPAL TUNNEL RELEASE Right   ? x3  ? CHOLECYSTECTOMY    ? OLECRANON BURSECTOMY Right 04/29/2021  ? Procedure: Right olecranon bursa excision;  Surgeon: Kennedy Bucker, MD;  Location: [facility] ORS;  Service: Orthopedics;  Laterality: Right;  ? REPLACEMENT TOTAL KNEE Right   ? ?Family History:  ?Family History  ?Problem Relation Age of Onset  ? Hypertension Mother   ? ?Family Psychiatric  History: see previous ?Social History:  ?Social  History  ? ?Substance and Sexual Activity  ?Alcohol Use Not Currently  ?   ?Social History  ? ?Substance and Sexual Activity  ?Drug Use Not Currently  ?  ?Social History  ? ?Socioeconomic History  ? Marital status: Married  ?  Spouse name: Not on file  ? Number of children: Not on file  ? Years of education: Not on file  ? Highest education level: Not on file  ?Occupational History  ? Not on file  ?Tobacco Use  ? Smoking status: Former  ?  Types: Cigarettes  ?  Quit date: 07/19/2021  ?  Years since quitting: 0.1  ? Smokeless tobacco: Former  ?Vaping Use  ? Vaping Use: Never used  ?Substance and Sexual Activity  ? Alcohol use: Not Currently  ? Drug use: Not Currently  ? Sexual activity: Not on file  ?Other Topics Concern  ? Not on file  ?Social History Narrative  ? Not on file  ? ?Social Determinants of Health  ? ?Financial Resource Strain: Not on file  ?Food Insecurity: Not on file  ?Transportation Needs: Not on file  ?Physical Activity: Not on file  ?Stress: Not on file  ?Social Connections: Not on file  ? ?Additional Social History: ?  ? ?Allergies:  No Known Allergies ? ?Labs:  ?Results for orders placed or performed during the hospital encounter of 09/23/21 (from the past 48 hour(s))  ?CBC     Status: None  ? Collection Time: 09/23/21  1:37 PM  ?Result Value Ref Range  ? WBC 9.0 4.0 - 10.5  K/uL  ? RBC 4.55 4.22 - 5.81 MIL/uL  ? Hemoglobin 14.3 13.0 - 17.0 g/dL  ? HCT 43.0 39.0 - 52.0 %  ? MCV 94.5 80.0 - 100.0 fL  ? MCH 31.4 26.0 - 34.0 pg  ? MCHC 33.3 30.0 - 36.0 g/dL  ? RDW 13.1 11.5 - 15.5 %  ? Platelets 286 150 - 400 K/uL  ? nRBC 0.0 0.0 - 0.2 %  ?  Comment: Performed at [facility], [address] [place], [place] [address]  ?Basic metabolic panel     Status: Abnormal  ? Collection Time: 09/23/21  1:37 PM  ?Result Value Ref Range  ? Sodium 137 135 - 145 mmol/L  ? Potassium 4.0 3.5 - 5.1 mmol/L  ? Chloride 104 98 - 111 mmol/L  ? CO2 25 22 - 32 mmol/L  ? Glucose, Bld 130 (H) 70 - 99 mg/dL  ?   Comment: Glucose reference range applies only to samples taken after fasting for at least 8 hours.  ? BUN 20 8 - 23 mg/dL  ? Creatinine, Ser 1.08 0.61 - 1.24 mg/dL  ? Calcium 8.9 8.9 - 10.3 mg/dL  ? GFR, Estimated >60 >60 mL/min  ?  Comment: (NOTE) ?Calculated using the CKD-EPI Creatinine Equation (2021) ?  ? Anion gap 8 5 - 15  ?  Comment: Performed at [facility], [address] [place], [place] [address]  ?Troponin I (High Sensitivity)     Status: None  ? Collection Time: 09/23/21  1:37 PM  ?Result Value Ref Range  ? Troponin I (High Sensitivity) 5 <18 ng/L  ?  Comment: (NOTE) ?Elevated high sensitivity troponin I (hsTnI) values and significant  ?changes across serial measurements may suggest ACS but many other  ?chronic and acute conditions are known to elevate hsTnI results.  ?Refer to the "Links" section for chest pain algorithms and additional  ?guidance. ?Performed at [facility], [address] [place], ?[place] [address] ?  ? ? ?Current Facility-Administered Medications  ?Medication Dose Route Frequency Provider Last Rate Last Admin  ? acetaminophen (TYLENOL) tablet 650 mg  650 mg Oral Q6H PRN Agbata, Tochukwu, MD      ? Or  ? acetaminophen (TYLENOL) suppository 650 mg  650 mg Rectal Q6H PRN Agbata, Tochukwu, MD      ? albuterol (PROVENTIL) (2.5 MG/3ML) 0.083% nebulizer solution 2.5 mL  2.5 mL Inhalation Q6H PRN Agbata, Tochukwu, MD      ? aspirin EC tablet 81 mg  81 mg Oral Daily Agbata, Tochukwu, MD   81 mg at 09/23/21 1707  ? atorvastatin (LIPITOR) tablet 40 mg  40 mg Oral QHS Agbata, Tochukwu, MD      ? carbidopa-levodopa (SINEMET IR) 25-100 MG per tablet immediate release 3 tablet  3 tablet Oral QID Agbata, Tochukwu, MD   3 tablet at 09/23/21 1803  ? [START ON 09/24/2021] cholecalciferol (VITAMIN D3) tablet 1,000 Units  1,000 Units Oral Daily Agbata, Tochukwu, MD      ? clonazePAM (KLONOPIN) tablet 0.5 mg  0.5 mg Oral BID PRN Mila Merryazari, Walid A, RPH   0.5 mg at 09/23/21 1715  ? And   ? clonazePAM (KLONOPIN) tablet 0.5 mg  0.5 mg Oral QHS Mila Merryazari, Walid A, RPH      ? [START ON 09/24/2021] entacapone (COMTAN) tablet 200 mg  200 mg Oral Daily Agbata, Tochukwu, MD      ? [START ON 09/24/2021] escitalopram (LEXAPRO) tablet 10 mg  10 mg Oral Daily Agbata,  Tochukwu, MD      ? gabapentin (NEURONTIN) capsule 100 mg  100 mg Oral BID Agbata, Tochukwu, MD      ? [START ON 09/24/2021] loratadine (CLARITIN) tablet 10 mg  10 mg Oral Daily Agbata, Tochukwu, MD      ? mometasone-formoterol (DULERA) 200-5 MCG/ACT inhaler 2 puff  2 puff Inhalation BID Agbata, Tochukwu, MD      ? ondansetron (ZOFRAN) tablet 4 mg  4 mg Oral Q6H PRN Agbata, Tochukwu, MD      ? Or  ? ondansetron (ZOFRAN) injection 4 mg  4 mg Intravenous Q6H PRN Agbata, Tochukwu, MD      ? sodium chloride flush (NS) 0.9 % injection 3 mL  3 mL Intravenous Q12H Agbata, Tochukwu, MD      ? ?Current Outpatient Medications  ?Medication Sig Dispense Refill  ? albuterol (VENTOLIN HFA) 108 (90 Base) MCG/ACT inhaler Inhale 1-2 puffs into the lungs every 6 (six) hours as needed for wheezing or shortness of breath.    ? Amantadine HCl 100 MG tablet Take 1 tablet (100 mg total) by mouth daily. 30 tablet 0  ? amLODipine-benazepril (LOTREL) 10-40 MG capsule Take 1 capsule by mouth daily.    ? aspirin EC 81 MG EC tablet Take 1 tablet (81 mg total) by mouth daily. Swallow whole. 30 tablet 11  ? atorvastatin (LIPITOR) 80 MG tablet Take 40 mg by mouth at bedtime.    ? budesonide (PULMICORT) 0.5 MG/2ML nebulizer solution Take 0.5 mg by nebulization in the morning and at bedtime.    ? carbidopa-levodopa (SINEMET IR) 25-100 MG tablet Take 3 tablets by mouth 4 (four) times daily.    ? carvedilol (COREG) 12.5 MG tablet Take 12.5 mg by mouth 2 (two) times daily.    ? cetirizine (ZYRTEC) 10 MG tablet Take 10 mg by mouth daily.    ? cholecalciferol (VITAMIN D3) 25 MCG (1000 UNIT) tablet Take 1,000 Units by mouth daily.    ? clonazePAM (KLONOPIN) 0.5 MG tablet Take 0.5 mg by mouth  See admin instructions. Take 0.5mg  twice daily as needed for tremors and 0.5mg  at bedtime for sleep.    ? entacapone (COMTAN) 200 MG tablet Take 200 mg by mouth daily.    ? escitalopram (LEXAPRO) 10 MG tablet T

## 2021-09-23 NOTE — ED Triage Notes (Signed)
Pt here after passing out and falling out in the ED parking lot. Visitors assisted pt to wheelchair, pt then moved to bed. Pt only stated " I feel dizzy." Pt shaking in bed.  ?

## 2021-09-23 NOTE — Assessment & Plan Note (Addendum)
Patient noted to have advanced Parkinson's disease with severe dyskinesia ?Chart review shows that patient has worsening dyskinesia ?Seen by his neurologist in February, 2023 who recommended that patient start Comtan 200 mg 4 x daily, taken with his Sinemet  ?He also recommends that patient discontinue amantadine ?We will request neurology consult ?Patient continues to drive despite having the severe dyskinesia and will probably need to have his driver's license taken away due to increased risk of MVAs ?

## 2021-09-23 NOTE — Assessment & Plan Note (Signed)
Most likely secondary to autonomic dysfunction from known Parkinson's disease ?Orthostatic blood pressure checks ?Place patient on cardiac monitor to rule out arrhythmia ?We will obtain 2D echocardiogram to assess LVEF and rule out aortic stenosis ?Consult cardiology ?

## 2021-09-23 NOTE — Assessment & Plan Note (Signed)
Treatment as outlined in 3 

## 2021-09-23 NOTE — Assessment & Plan Note (Signed)
-  Continue Klonopin °

## 2021-09-23 NOTE — Assessment & Plan Note (Signed)
Recently hospitalized for management of bipolar disorder/major depressive type ?Continue Lexapro and Klonopin ?We will request psychiatry consult during this hospitalization due to concerns from his wife about erratic behavior and poor judgment ?

## 2021-09-23 NOTE — ED Notes (Signed)
Pt taken to CT at this time.

## 2021-09-24 ENCOUNTER — Observation Stay
Admit: 2021-09-24 | Discharge: 2021-09-24 | Disposition: A | Discharge status: Home / Self Care | Payer: Medicare HMO | Attending: Cardiology | Admitting: Cardiology

## 2021-09-24 DIAGNOSIS — G20 Parkinson's disease: Secondary | ICD-10-CM | POA: Diagnosis not present

## 2021-09-24 DIAGNOSIS — G249 Dystonia, unspecified: Secondary | ICD-10-CM

## 2021-09-24 DIAGNOSIS — R55 Syncope and collapse: Secondary | ICD-10-CM | POA: Diagnosis not present

## 2021-09-24 DIAGNOSIS — I951 Orthostatic hypotension: Secondary | ICD-10-CM | POA: Diagnosis not present

## 2021-09-24 LAB — CBC
HCT: 38.6 % — ABNORMAL LOW (ref 39.0–52.0)
Hemoglobin: 12.9 g/dL — ABNORMAL LOW (ref 13.0–17.0)
MCH: 31.3 pg (ref 26.0–34.0)
MCHC: 33.4 g/dL (ref 30.0–36.0)
MCV: 93.7 fL (ref 80.0–100.0)
Platelets: 278 10*3/uL (ref 150–400)
RBC: 4.12 MIL/uL — ABNORMAL LOW (ref 4.22–5.81)
RDW: 13.2 % (ref 11.5–15.5)
WBC: 5.3 10*3/uL (ref 4.0–10.5)
nRBC: 0 % (ref 0.0–0.2)

## 2021-09-24 LAB — ECHOCARDIOGRAM COMPLETE
AR max vel: 3.98 cm2
AV Area VTI: 4.48 cm2
AV Area mean vel: 4.08 cm2
AV Mean grad: 3 mmHg
AV Peak grad: 5.4 mmHg
Ao pk vel: 1.16 m/s
Area-P 1/2: 5.02 cm2
Height: 71 in
MV VTI: 7.58 cm2
S' Lateral: 2.63 cm
Weight: 2740.76 oz

## 2021-09-24 LAB — BASIC METABOLIC PANEL
Anion gap: 7 (ref 5–15)
BUN: 13 mg/dL (ref 8–23)
CO2: 26 mmol/L (ref 22–32)
Calcium: 8.6 mg/dL — ABNORMAL LOW (ref 8.9–10.3)
Chloride: 105 mmol/L (ref 98–111)
Creatinine, Ser: 0.7 mg/dL (ref 0.61–1.24)
GFR, Estimated: >60 mL/min (ref >60)
Glucose, Bld: 125 mg/dL — ABNORMAL HIGH (ref 70–99)
Potassium: 3.3 mmol/L — ABNORMAL LOW (ref 3.5–5.1)
Sodium: 138 mmol/L (ref 135–145)

## 2021-09-24 MED ORDER — ENTACAPONE 200 MG PO TABS
200.0000 mg | ORAL_TABLET | Freq: Four times a day (QID) | ORAL | Status: DC
  Administered 2021-09-24 – 2021-09-26 (×6): 200 mg via ORAL
  Filled 2021-09-24 (×9): qty 1

## 2021-09-24 MED ORDER — ENSURE ENLIVE PO LIQD
237.0000 mL | Freq: Three times a day (TID) | ORAL | Status: DC
  Administered 2021-09-24 – 2021-09-26 (×5): 237 mL via ORAL

## 2021-09-24 MED ORDER — CARBIDOPA-LEVODOPA 25-100 MG PO TABS
3.0000 | ORAL_TABLET | Freq: Four times a day (QID) | ORAL | Status: DC
  Administered 2021-09-24 – 2021-09-26 (×7): 3 via ORAL
  Filled 2021-09-24 (×8): qty 3

## 2021-09-24 MED ORDER — POTASSIUM CHLORIDE CRYS ER 20 MEQ PO TBCR
40.0000 meq | EXTENDED_RELEASE_TABLET | Freq: Two times a day (BID) | ORAL | Status: DC
  Administered 2021-09-24 – 2021-09-26 (×5): 40 meq via ORAL
  Filled 2021-09-24 (×5): qty 2

## 2021-09-24 MED ORDER — ADULT MULTIVITAMIN W/MINERALS CH
1.0000 | ORAL_TABLET | Freq: Every day | ORAL | Status: DC
  Administered 2021-09-24 – 2021-09-26 (×3): 1 via ORAL
  Filled 2021-09-24 (×3): qty 1

## 2021-09-24 NOTE — TOC Initial Note (Signed)
Transition of Care (TOC) - Initial/Assessment Note  ? ? ?Patient Details  ?Name: Jeremiah Taylor. ?MRN: [id number] ?Date of Birth: [birth year] ? ?Transition of Care (TOC) CM/SW Contact:    ?Marlowe Sax, RN ?Phone Number: ?09/24/2021, 11:00 AM ? ?Clinical Narrative:                 ?Patient comes from home, He was discharged from Hospital recently ?  ? Patient was at [employer] yesterday morning, reports while they are holding a cup of coffee he suddenly felt weak lightheaded and then passed out.  Coffee did not spill on him.  He reports he was able to get himself up get in his car and drove to the ER.  The ER he was getting out of his car stood up and passed out again.  Awoke quickly and noted that he felt like he had struck his head sore over the back.  Again feeling like he passed out. ? ?TOC will monitor and assist with needs ? ? ?Patient Goals and CMS Choice ?  ?  ?  ? ?Expected Discharge Plan and Services ?  ?  ?  ?  ?  ?                ?  ?  ?  ?  ?  ?  ?  ?  ?  ?  ? ?Prior Living Arrangements/Services ?  ?  ?  ?       ?  ?  ?  ?  ? ?Activities of Daily Living ?  ?ADL Screening (condition at time of admission) ?Patient's cognitive ability adequate to safely complete daily activities?: Yes ?Is the patient deaf or have difficulty hearing?: No ?Does the patient have difficulty seeing, even when wearing glasses/contacts?: No ?Does the patient have difficulty concentrating, remembering, or making decisions?: No ?Patient able to express need for assistance with ADLs?: Yes ?Does the patient have difficulty dressing or bathing?: No ?Independently performs ADLs?: No ?Communication: Independent ?Dressing (OT): Independent ?Grooming: Independent ?Feeding: Independent ?Bathing: Independent ?Toileting: Needs assistance ?Is this a change from baseline?: Change from baseline, expected to last <3 days ?In/Out Bed: Needs assistance ?Is this a change from baseline?: Change from baseline, expected to last <3 days ?Walks in  Home: Independent ?Does the patient have difficulty walking or climbing stairs?: Yes ?Weakness of Legs: Both ?Weakness of Arms/Hands: None ? ?Permission Sought/Granted ?  ?  ?   ?   ?   ?   ? ?Emotional Assessment ?  ?  ?  ?  ?  ?  ? ?Admission diagnosis:  Syncope and collapse [R55] ?Patient Active Problem List  ? Diagnosis Date Noted  ? Syncope and collapse 09/23/2021  ? Bipolar 2 disorder, major depressive episode (HCC) 09/17/2021  ? MDD (major depressive disorder) 09/16/2021  ? Aggressive behavior 09/14/2021  ? Chest pain 05/25/2021  ? Atypical chest pain 05/25/2021  ? CAD (coronary artery disease) 05/25/2021  ? Anxiety   ? COPD with acute exacerbation (HCC) 01/21/2021  ? Acute respiratory failure with hypoxia (HCC) 01/21/2021  ? COPD exacerbation (HCC) 01/21/2021  ? Parkinson's disease (HCC) 02/17/2020  ? Type 2 diabetes mellitus with other specified complication (HCC) 07/20/2019  ? HTN (hypertension) 10/04/2006  ? ?PCP:  Enid Baas, MD ?Pharmacy:   ?[facility] [address] [place] - [phone] ?[address] ?[place] [address] ?Phone: [phone] Fax: [phone] ? ? ? ? ?Social Determinants of Health (SDOH) Interventions ?  ? ?  Readmission Risk Interventions ?   ? View : No data to display.  ?  ?  ?  ? ? ? ?

## 2021-09-24 NOTE — Progress Notes (Signed)
*  PRELIMINARY RESULTS* ?Echocardiogram ?2D Echocardiogram has been performed. ? ?Jeremiah Taylor ?09/24/2021, 10:23 AM ?

## 2021-09-24 NOTE — Progress Notes (Signed)
[place] at [facility] ? ? ?PATIENT NAME: Jeremiah Taylor   ? ?MR#:  [id number] ? ?DATE OF BIRTH:  1960-09-24 ? ?SUBJECTIVE:  ?patient came in with increasing involuntary movements in both upper and lower extremity more in the upper extremity with intermittent falls. Not sure if he has true loss of consciousness since he is aware of these falls. He tells me he has been taking his Parkinson's medication how were the ones charted are not the same as what his neurologist had recommended on outpatient visit. No family at bedside but ?patient is living last few days and motel. Not getting along with his wife ? ? ?VITALS:  ?Blood pressure 123/75, pulse 72, temperature (!) 97.5 ?F (36.4 ?C), temperature source Oral, resp. rate 16, height 5\' 11"  (1.803 m), weight 77.7 kg, SpO2 96 %. ? ?PHYSICAL EXAMINATION:  ? ?GENERAL:  61 y.o.-year-old patient lying in the bed with no acute distress.  ?LUNGS: Normal breath sounds bilaterally, no wheezing, rales, rhonchi.  ?CARDIOVASCULAR: S1, S2 normal. No murmurs, rubs, or gallops.  ?ABDOMEN: Soft, nontender, nondistended. Bowel sounds present.  ?EXTREMITIES: No  edema b/l.    ?NEUROLOGIC: nonfocal  patient is alert and awake. Dyskinesias in UE ?SKIN: No obvious rash, lesion, or ulcer.  ? ?LABORATORY PANEL:  ?CBC ?Recent Labs  ?Lab 09/24/21 ?[id number]  ?WBC 5.3  ?HGB 12.9*  ?HCT 38.6*  ?PLT 278  ? ? ?Chemistries  ?Recent Labs  ?Lab 09/24/21 ?[id number]  ?NA 138  ?K 3.3*  ?CL 105  ?CO2 26  ?GLUCOSE 125*  ?BUN 13  ?CREATININE 0.70  ?CALCIUM 8.6*  ? ?Cardiac Enzymes ?No results for input(s): TROPONINI in the last 168 hours. ?RADIOLOGY:  ?CT Head Wo Contrast ? ?Result Date: 09/23/2021 ?CLINICAL DATA:  Head trauma, intracranial venous injury suspected EXAM: CT HEAD WITHOUT CONTRAST TECHNIQUE: Contiguous axial images were obtained from the base of the skull through the vertex without intravenous contrast. RADIATION DOSE REDUCTION: This exam was performed according to the  departmental dose-optimization program which includes automated exposure control, adjustment of the mA and/or kV according to patient size and/or use of iterative reconstruction technique. COMPARISON:  None. FINDINGS: Mildly motion limited study.  Within this limitation: Brain: No evidence of acute infarction, hemorrhage, hydrocephalus, extra-axial collection or mass lesion/mass effect. Partially empty sella. Vascular: No hyperdense vessel identified. Calcific intracranial atherosclerosis. Skull: No acute fracture. Sinuses/Orbits: Clear visualized sinuses. No acute orbital findings. Other: Trace right mastoid effusion. IMPRESSION: Mildly motion limited study without evidence of acute intracranial abnormality. Electronically Signed   By: Jeremiah Taylor M.D.   On: 09/23/2021 14:45  ? ?ECHOCARDIOGRAM COMPLETE ? ?Result Date: 09/24/2021 ?   ECHOCARDIOGRAM REPORT   Patient Name:   Jeremiah Taylor. Date of Exam: 09/24/2021 Medical Rec #:  [id number]         Height:       71.0 in Accession #:    [id number]        Weight:       171.3 lb Date of Birth:  [birth year]        BSA:          1.974 m? Patient Age:    17 years          BP:           135/82 mmHg Patient Gender: M                 HR:  68 bpm. Exam Location:  [facility] Procedure: 2D Echo, Color Doppler and Cardiac Doppler Indications:     R55 Syncope  History:         Patient has no prior history of Echocardiogram examinations.                  Parkinson's disease; Risk Factors:Hypertension.  Sonographer:     Jeremiah Taylor Referring Phys:  [id number] [place] TANG Diagnosing Phys: Jeremiah Taylor  Sonographer Comments: Suboptimal apical window. TDS due to inability of pt to remain still due to Parkinson's. IMPRESSIONS  1. Left ventricular ejection fraction, by estimation, is 60 to 65%. The left ventricle has normal function. The left ventricle has no regional wall motion abnormalities. Left ventricular diastolic function could not be evaluated.  2. Right ventricular  systolic function is normal. The right ventricular size is normal.  3. The mitral valve is normal in structure. No evidence of mitral valve regurgitation. No evidence of mitral stenosis.  4. The aortic valve is normal in structure. Aortic valve regurgitation is not visualized. No aortic stenosis is present.  5. The inferior vena cava is normal in size with greater than 50% respiratory variability, suggesting right atrial pressure of 3 mmHg. FINDINGS  Left Ventricle: Left ventricular ejection fraction, by estimation, is 60 to 65%. The left ventricle has normal function. The left ventricle has no regional wall motion abnormalities. The left ventricular internal cavity size was normal in size. There is  no left ventricular hypertrophy. Left ventricular diastolic function could not be evaluated. Right Ventricle: The right ventricular size is normal. No increase in right ventricular wall thickness. Right ventricular systolic function is normal. Left Atrium: Left atrial size was normal in size. Right Atrium: Right atrial size was normal in size. Pericardium: There is no evidence of pericardial effusion. Mitral Valve: The mitral valve is normal in structure. No evidence of mitral valve regurgitation. No evidence of mitral valve stenosis. MV peak gradient, 1.3 mmHg. The mean mitral valve gradient is 1.0 mmHg. Tricuspid Valve: The tricuspid valve is normal in structure. Tricuspid valve regurgitation is not demonstrated. No evidence of tricuspid stenosis. Aortic Valve: The aortic valve is normal in structure. Aortic valve regurgitation is not visualized. No aortic stenosis is present. Aortic valve mean gradient measures 3.0 mmHg. Aortic valve peak gradient measures 5.4 mmHg. Aortic valve area, by VTI measures 4.48 cm?. Pulmonic Valve: The pulmonic valve was not well visualized. Aorta: The aortic root is normal in size and structure. Venous: The inferior vena cava is normal in size with greater than 50% respiratory  variability, suggesting right atrial pressure of 3 mmHg. IAS/Shunts: No atrial level shunt detected by color flow Doppler.  LEFT VENTRICLE PLAX 2D LVIDd:         4.52 cm   Diastology LVIDs:         2.63 cm   LV e' medial:   7.72 cm/s LV PW:         1.05 cm   LV E/e' medial: 6.0 LV IVS:        1.05 cm LVOT diam:     2.40 cm LV SV:         94 LV SV Index:   48 LVOT Area:     4.52 cm?  LEFT ATRIUM         Index LA diam:    3.90 cm 1.98 cm/m?  AORTIC VALVE AV Area (Vmax):    3.98 cm? AV Area (Vmean):   4.08 cm?  AV Area (VTI):     4.48 cm? AV Vmax:           116.00 cm/s AV Vmean:          77.300 cm/s AV VTI:            0.210 m AV Peak Grad:      5.4 mmHg AV Mean Grad:      3.0 mmHg LVOT Vmax:         102.00 cm/s LVOT Vmean:        69.700 cm/s LVOT VTI:          0.208 m LVOT/AV VTI ratio: 0.99  AORTA Ao Root diam: 3.30 cm MITRAL VALVE MV Area (PHT): 5.02 cm?    SHUNTS MV Area VTI:   7.58 cm?    Systemic VTI:  0.21 m MV Peak grad:  1.3 mmHg    Systemic Diam: 2.40 cm MV Mean grad:  1.0 mmHg MV Vmax:       0.56 m/s MV Vmean:      36.9 cm/s MV Decel Time: 151 msec MV E velocity: 46.70 cm/s MV A velocity: 68.60 cm/s MV E/A ratio:  0.68 Jeremiah Taylor Electronically signed by Jeremiah Taylor Signature Date/Time: 09/24/2021/10:59:25 AM    Final    ? ?Assessment and Plan ? ?Jeremiah Taylor. is a 61 y.o. male with medical history significant for Parkinson's disease, hypertension, anxiety disorder who presents to the emergency room for evaluation " after he fell out twice". ?Patient states that he has been feeling dizzy and lightheaded for the last 24 hours. ? ?Syncope and collapse ?--Most likely secondary to vasovagal/autonomic dysfunction from known Parkinson's disease ?--remains in NSR ?--Consult cardiology--no recs other than d/c coreg ?  ?Parkinson's disease ([place]) ?--Patient noted to have advanced Parkinson's disease with severe dyskinesia ?--Chart review shows that patient has worsening dyskinesia ?Seen by his neurologist in February,  2023 who recommended that patient start Comtan 200 mg 4 x daily, taken with his Sinemet  ?He also recommends that patient discontinue amantadine ?--- neurology consult with Dr Quinn Axe ?--Patient continues to drive despite

## 2021-09-24 NOTE — Consult Note (Addendum)
?[facility] CONSULT NOTE  ? ?    ?Patient ID: ?Jeremiah Taylor. ?MRN: [id number] ?DOB/AGE: Dec 06, 1960 61 y.o. ? ?Admit date: 09/23/2021 ?Referring Physician Dr. Joylene Igo ?Primary Physician Dr. Marisue Ivan ?Primary Cardiologist Dr. Launa Flight, [facility] ?Reason for Consultation syncope ? ?HPI: The patient is a 60yoM with a PMH of severe parkinsons disease, SVT requiring cardioversion in 02/2020, asthma/COPD, mild-mod CAD (60% in diag branch of LAD, no other severe stenosis), hypertension, who presented to [facility] ED 09/23/2021 after two syncopal episodes. Cardiology is consulted for further assistance.  ? ?The patient states he was driving to [employer] to get some coffee at [employer] when he was leaving the store he felt weak and dizzy and then "passed out" catching himself on the door.  He then drove to the emergency room here wanting to see his neurologist when he got out of his car and then felt weak and dizzy again and the next thing he he knew he was in the emergency department.  Per chart review he was assisted into a wheelchair and brought into the ED.  He said he has lost about 20 pounds recently he has been feeling very weak in his lower leg muscles he attributes to his Parkinson's disease.  He denies missed doses of his medications.  Denies chest pain, shortness of breath, palpitations, heart racing.  No nausea/vomiting his only complaint currently is some pain in the back of his neck and feeling generally weak. ? ?Vitals are notable for a blood pressure of 113/63 and heart rate ranging between 58 and 76.  No pauses or high degree block noted on telemetry so far.  Labs are notable for a potassium of 3.3, creatinine 0.70, GFR greater than 60.  Troponin x1 is negative at 5.  H&H downtrending from 14.3-12.9 crit 43-30 8.6.  Platelets 278.  Blood glucose 125.  CT head negative for acute intracranial abnormalities. ? ? ?Review of systems complete and found to be negative unless listed  above  ? ? ? ?Past Medical History:  ?Diagnosis Date  ? Anxiety   ? Asthma   ? Hypertension   ? Parkinson disease (HCC)   ?  ?Past Surgical History:  ?Procedure Laterality Date  ? CARPAL TUNNEL RELEASE Right   ? x3  ? CHOLECYSTECTOMY    ? OLECRANON BURSECTOMY Right 04/29/2021  ? Procedure: Right olecranon bursa excision;  Surgeon: Kennedy Bucker, MD;  Location: [facility] ORS;  Service: Orthopedics;  Laterality: Right;  ? REPLACEMENT TOTAL KNEE Right   ?  ?Medications Prior to Admission  ?Medication Sig Dispense Refill Last Dose  ? albuterol (VENTOLIN HFA) 108 (90 Base) MCG/ACT inhaler Inhale 1-2 puffs into the lungs every 6 (six) hours as needed for wheezing or shortness of breath.   Past Month  ? Amantadine HCl 100 MG tablet Take 1 tablet (100 mg total) by mouth daily. 30 tablet 0 09/23/2021  ? amLODipine-benazepril (LOTREL) 10-40 MG capsule Take 1 capsule by mouth daily.   09/23/2021  ? aspirin EC 81 MG EC tablet Take 1 tablet (81 mg total) by mouth daily. Swallow whole. 30 tablet 11 09/22/2021  ? atorvastatin (LIPITOR) 80 MG tablet Take 40 mg by mouth at bedtime.   09/22/2021  ? budesonide (PULMICORT) 0.5 MG/2ML nebulizer solution Take 0.5 mg by nebulization in the morning and at bedtime.   09/22/2021  ? carbidopa-levodopa (SINEMET IR) 25-100 MG tablet Take 3 tablets by mouth 4 (four) times daily.   09/23/2021  ? carvedilol (COREG)  12.5 MG tablet Take 12.5 mg by mouth 2 (two) times daily.   09/23/2021 at 0900  ? cetirizine (ZYRTEC) 10 MG tablet Take 10 mg by mouth daily.   09/23/2021  ? cholecalciferol (VITAMIN D3) 25 MCG (1000 UNIT) tablet Take 1,000 Units by mouth daily.   09/23/2021  ? clonazePAM (KLONOPIN) 0.5 MG tablet Take 0.5 mg by mouth See admin instructions. Take 0.5mg  twice daily as needed for tremors and 0.5mg  at bedtime for sleep.   09/22/2021  ? entacapone (COMTAN) 200 MG tablet Take 200 mg by mouth daily.   09/23/2021  ? escitalopram (LEXAPRO) 10 MG tablet Take 10 mg by mouth daily.   09/23/2021  ? fluticasone  (FLONASE) 50 MCG/ACT nasal spray Place 1 spray into the nose 2 (two) times daily as needed for allergies.   09/23/2021  ? gabapentin (NEURONTIN) 100 MG capsule Take 1 capsule (100 mg total) by mouth 3 (three) times daily. (Patient taking differently: Take 100 mg by mouth 2 (two) times daily.) 90 capsule 0 09/23/2021  ? SYMBICORT 160-4.5 MCG/ACT inhaler Inhale 2 puffs into the lungs See admin instructions. Twice daily every other day   09/23/2021  ? TRELEGY ELLIPTA 200-62.5-25 MCG/ACT AEPB Inhale 1 puff into the lungs every other day.   09/22/2021  ? ?Social History  ? ?Socioeconomic History  ? Marital status: Married  ?  Spouse name: Not on file  ? Number of children: Not on file  ? Years of education: Not on file  ? Highest education level: Not on file  ?Occupational History  ? Not on file  ?Tobacco Use  ? Smoking status: Former  ?  Types: Cigarettes  ?  Quit date: 07/19/2021  ?  Years since quitting: 0.1  ? Smokeless tobacco: Former  ?Vaping Use  ? Vaping Use: Never used  ?Substance and Sexual Activity  ? Alcohol use: Not Currently  ? Drug use: Not Currently  ? Sexual activity: Not on file  ?Other Topics Concern  ? Not on file  ?Social History Narrative  ? Not on file  ? ?Social Determinants of Health  ? ?Financial Resource Strain: Not on file  ?Food Insecurity: Not on file  ?Transportation Needs: Not on file  ?Physical Activity: Not on file  ?Stress: Not on file  ?Social Connections: Not on file  ?Intimate Partner Violence: Not on file  ?  ?Family History  ?Problem Relation Age of Onset  ? Hypertension Mother   ?  ? ? ?Review of systems complete and found to be negative unless listed above  ? ? ?PHYSICAL EXAM ?General: Pleasant middle-aged black male, well nourished, in no acute distress.  Lying nearly flat in hospital bed.  Slight tremulor of left upper extremity. ?HEENT:  Normocephalic and atraumatic. ?Neck:  No JVD.  ?Lungs: Normal respiratory effort on room air. Clear bilaterally to auscultation. No wheezes,  crackles, rhonchi.  ?Heart: HRRR . Normal S1 and S2 without gallops or murmurs. Radial & DP pulses 2+ bilaterally. ?Abdomen: Non-distended appearing.  ?Msk: Normal strength and tone for age. ?Extremities: Warm and well perfused. No clubbing, cyanosis.  No peripheral edema.  ?Neuro: Alert and oriented X 3. ?Psych:  Answers questions appropriately.  ? ?Labs: ?  ?Lab Results  ?Component Value Date  ? WBC 5.3 09/24/2021  ? HGB 12.9 (L) 09/24/2021  ? HCT 38.6 (L) 09/24/2021  ? MCV 93.7 09/24/2021  ? PLT 278 09/24/2021  ?  ?Recent Labs  ?Lab 09/24/21 ?[id number]  ?NA 138  ?K 3.3*  ?  CL 105  ?CO2 26  ?BUN 13  ?CREATININE 0.70  ?CALCIUM 8.6*  ?GLUCOSE 125*  ? ?Lab Results  ?Component Value Date  ? CKTOTAL 214 05/25/2021  ?  ?Lab Results  ?Component Value Date  ? CHOL 142 05/26/2021  ? ?Lab Results  ?Component Value Date  ? HDL 39 (L) 05/26/2021  ? ?Lab Results  ?Component Value Date  ? Sharon 85 05/26/2021  ? ?Lab Results  ?Component Value Date  ? TRIG 90 05/26/2021  ? ?Lab Results  ?Component Value Date  ? CHOLHDL 3.6 05/26/2021  ? ?No results found for: LDLDIRECT  ?  ?Radiology: CT Head Wo Contrast ? ?Result Date: 09/23/2021 ?CLINICAL DATA:  Head trauma, intracranial venous injury suspected EXAM: CT HEAD WITHOUT CONTRAST TECHNIQUE: Contiguous axial images were obtained from the base of the skull through the vertex without intravenous contrast. RADIATION DOSE REDUCTION: This exam was performed according to the departmental dose-optimization program which includes automated exposure control, adjustment of the mA and/or kV according to patient size and/or use of iterative reconstruction technique. COMPARISON:  None. FINDINGS: Mildly motion limited study.  Within this limitation: Brain: No evidence of acute infarction, hemorrhage, hydrocephalus, extra-axial collection or mass lesion/mass effect. Partially empty sella. Vascular: No hyperdense vessel identified. Calcific intracranial atherosclerosis. Skull: No acute fracture.  Sinuses/Orbits: Clear visualized sinuses. No acute orbital findings. Other: Trace right mastoid effusion. IMPRESSION: Mildly motion limited study without evidence of acute intracranial abnormality. Electronically Signed

## 2021-09-24 NOTE — Progress Notes (Signed)
Initial Nutrition Assessment ? ?DOCUMENTATION CODES:  ? ?Not applicable ? ?INTERVENTION:  ? ?-Ensure Enlive po TID, each supplement provides 350 kcal and 20 grams of protein ?-MVI with minerals daily ? ?NUTRITION DIAGNOSIS:  ? ?Inadequate oral intake related to decreased appetite as evidenced by per patient/family report. ? ?GOAL:  ? ?Patient will meet greater than or equal to 90% of their needs ? ?MONITOR:  ? ?PO intake, Supplement acceptance, Labs, Weight trends, Skin, I & O's ? ?REASON FOR ASSESSMENT:  ? ?Malnutrition Screening Tool ?  ? ?ASSESSMENT:  ? ?Jeremiah Taylor. is a 61 y.o. male with medical history significant for Parkinson's disease, hypertension, anxiety disorder who presents to the emergency room for evaluation " after he fell out twice". ? ?Pt admitted with syncope and collapse.  ? ?Reviewed I/O's: -250 ml x 24 hours  ? ?Spoke with pt at bedside, who reports feeling better but complains of being lightheaded. Pt shares that he has had decreased oral intake secondary to poor appetite over the past 3 months. 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 He denies any difficulty chewing or swallowing. ? ?Per pt, his UBW is between 170-180#. He estimates he has lost 50# over the past 3 months. He has noticed changes in his clothing, going down from a 36 in waist size to a 30 in waist size. He is concerned about muscle atrophy, which is suspects is related to Parkinson's disease. Reviewed wt hx; pt has experienced a 10.7% wt loss over the past 6 months, which is significant for time frame.  ? ?Discussed importance of good meal and supplement intake to promote healing. Pt has not tried supplements in the past, but states "I think I probably should". Pt amenable to Ensure.   ? ?Medications reviewed and include sinemet and potassium chloride.  ? ?Labs reviewed: K: 3.3, CBGS: 128.  ? ?NUTRITION - FOCUSED PHYSICAL EXAM: ? ?Flowsheet Row Most Recent Value  ?Orbital Region No depletion  ?Upper Arm Region Moderate depletion  ?Thoracic and Lumbar Region No depletion  ?Buccal Region No depletion  ?Temple Region Mild depletion  ?Clavicle Bone Region No depletion  ?Clavicle and Acromion Bone Region No depletion  ?Scapular Bone Region No depletion  ?Dorsal Hand No depletion  ?Patellar Region Mild depletion  ?Anterior Thigh Region Mild depletion  ?Posterior Calf Region Mild depletion  ?Edema (RD Assessment) None  ?Hair Reviewed  ?Eyes Reviewed  ?Mouth Reviewed  ?Skin Reviewed  ?Nails Reviewed  ? ?  ? ? ?Diet Order:   ?Diet Order   ? ?       ?  Diet 2 gram sodium Room service appropriate? Yes; Fluid consistency: Thin  Diet effective now       ?  ? ?  ?  ? ?  ? ? ?EDUCATION NEEDS:  ? ?Education needs have been addressed ? ?Skin:  Skin Assessment: Reviewed RN Assessment ? ?Last BM:  09/23/21 ? ?Height:  ? ?Ht Readings from Last 1 Encounters:  ?09/23/21 5\' 11"  (1.803 m)  ? ? ?Weight:  ? ?Wt Readings from Last 1 Encounters:  ?09/24/21 77.7 kg  ? ? ?Ideal Body Weight:  78.2 kg ? ?BMI:  Body mass index is 23.89 kg/m?. ? ?Estimated Nutritional Needs:  ? ?Kcal:  2150-2350 ? ?Protein:  115-130 grams ? ?Fluid:  > 2 L ? ? ? ?Levada Schilling, RD, LDN, CDCES ?Registered Dietitian II ?Certified Diabetes Care and Education Specialist ?Please refer to [facility] for RD and/or RD on-call/weekend/after hours pager  ?

## 2021-09-24 NOTE — Progress Notes (Incomplete)
Amantadine 100mg  qid to 100mg  bid ?Levodopa-carbidopa 25/100 - 3 pills 3-4x day ?No ER - has it but makes him dizzy so he doesn't take ? ?

## 2021-09-25 DIAGNOSIS — R55 Syncope and collapse: Secondary | ICD-10-CM | POA: Diagnosis not present

## 2021-09-25 DIAGNOSIS — G249 Dystonia, unspecified: Secondary | ICD-10-CM | POA: Diagnosis not present

## 2021-09-25 DIAGNOSIS — G20 Parkinson's disease: Secondary | ICD-10-CM | POA: Diagnosis not present

## 2021-09-25 MED ORDER — AMANTADINE HCL 100 MG PO CAPS
100.0000 mg | ORAL_CAPSULE | Freq: Two times a day (BID) | ORAL | Status: DC
  Administered 2021-09-25 – 2021-09-26 (×3): 100 mg via ORAL
  Filled 2021-09-25 (×3): qty 1

## 2021-09-25 NOTE — Progress Notes (Signed)
?[facility] CONSULT NOTE  ? ?    ?Patient ID: ?Jeremiah SciaraJohnnie Varricchio Jr. ?MRN: [id number] ?DOB/AGE: 11/30/1960 61 y.o. ? ?Admit date: 09/23/2021 ?Referring Physician Dr. Joylene IgoAgbata ?Primary Physician Dr. Marisue IvanKanhka Linthavong ?Primary Cardiologist Dr. Launa FlightEric Moore, [facility] ?Reason for Consultation syncope ? ?HPI: The patient is a 60yoM with a PMH of severe parkinsons disease, SVT requiring cardioversion in 02/2020, asthma/COPD, mild-mod CAD (60% in diag branch of LAD, no other severe stenosis), hypertension, who presented [facility] ED 09/23/2021 after two syncopal episodes. Cardiology is consulted for further assistance.  ? ?Interval History: ?-seen this morning while having significant dyskinesias, admits to transient dizziness but denies syncope overnight. No chest pain or palpitations. ?-tele unable for review overnight, HR by pulse ox in the low 60s ? ? ?Review of systems complete and found to be negative unless listed above  ? ? ? ?Past Medical History:  ?Diagnosis Date  ? Anxiety   ? Asthma   ? Hypertension   ? Parkinson disease (HCC)   ?  ?Past Surgical History:  ?Procedure Laterality Date  ? CARPAL TUNNEL RELEASE Right   ? x3  ? CHOLECYSTECTOMY    ? OLECRANON BURSECTOMY Right 04/29/2021  ? Procedure: Right olecranon bursa excision;  Surgeon: Kennedy BuckerMenz, Michael, MD;  Location: [facility] ORS;  Service: Orthopedics;  Laterality: Right;  ? REPLACEMENT TOTAL KNEE Right   ?  ?Medications Prior to Admission  ?Medication Sig Dispense Refill Last Dose  ? albuterol (VENTOLIN HFA) 108 (90 Base) MCG/ACT inhaler Inhale 1-2 puffs into the lungs every 6 (six) hours as needed for wheezing or shortness of breath.   Past Month  ? amLODipine-benazepril (LOTREL) 10-40 MG capsule Take 1 capsule by mouth daily.   09/23/2021  ? aspirin EC 81 MG EC tablet Take 1 tablet (81 mg total) by mouth daily. Swallow whole. 30 tablet 11 09/22/2021  ? atorvastatin (LIPITOR) 80 MG tablet Take 40 mg by mouth at bedtime.   09/22/2021  ? budesonide  (PULMICORT) 0.5 MG/2ML nebulizer solution Take 0.5 mg by nebulization in the morning and at bedtime.   09/22/2021  ? carbidopa-levodopa (SINEMET IR) 25-100 MG tablet Take 3 tablets by mouth 4 (four) times daily.   09/23/2021  ? carvedilol (COREG) 12.5 MG tablet Take 12.5 mg by mouth 2 (two) times daily.   09/23/2021 at 0900  ? cetirizine (ZYRTEC) 10 MG tablet Take 10 mg by mouth daily.   09/23/2021  ? cholecalciferol (VITAMIN D3) 25 MCG (1000 UNIT) tablet Take 1,000 Units by mouth daily.   09/23/2021  ? clonazePAM (KLONOPIN) 0.5 MG tablet Take 0.5 mg by mouth See admin instructions. Take 0.5mg  twice daily as needed for tremors and 0.5mg  at bedtime for sleep.   09/22/2021  ? entacapone (COMTAN) 200 MG tablet Take 200 mg by mouth 4 (four) times daily.   09/23/2021  ? escitalopram (LEXAPRO) 10 MG tablet Take 10 mg by mouth daily.   09/23/2021  ? fluticasone (FLONASE) 50 MCG/ACT nasal spray Place 1 spray into the nose 2 (two) times daily as needed for allergies.   09/23/2021  ? gabapentin (NEURONTIN) 100 MG capsule Take 1 capsule (100 mg total) by mouth 3 (three) times daily. (Patient taking differently: Take 100 mg by mouth 2 (two) times daily.) 90 capsule 0 09/23/2021  ? SYMBICORT 160-4.5 MCG/ACT inhaler Inhale 2 puffs into the lungs See admin instructions. Twice daily every other day   09/23/2021  ? TRELEGY ELLIPTA 200-62.5-25 MCG/ACT AEPB Inhale 1 puff into the lungs every  other day.   09/22/2021  ? Amantadine HCl 100 MG tablet Take 1 tablet (100 mg total) by mouth daily. (Patient taking differently: Take 100 mg by mouth 2 (two) times daily.) 30 tablet 0   ? Carbidopa-Levodopa ER 61.25-245 MG CPCR Take 1 capsule by mouth 4 (four) times daily. (Patient not taking: Reported on 09/24/2021)   Not Taking  ? ?Social History  ? ?Socioeconomic History  ? Marital status: Married  ?  Spouse name: Not on file  ? Number of children: Not on file  ? Years of education: Not on file  ? Highest education level: Not on file  ?Occupational History   ? Not on file  ?Tobacco Use  ? Smoking status: Former  ?  Types: Cigarettes  ?  Quit date: 07/19/2021  ?  Years since quitting: 0.1  ? Smokeless tobacco: Former  ?Vaping Use  ? Vaping Use: Never used  ?Substance and Sexual Activity  ? Alcohol use: Not Currently  ? Drug use: Not Currently  ? Sexual activity: Not on file  ?Other Topics Concern  ? Not on file  ?Social History Narrative  ? Not on file  ? ?Social Determinants of Health  ? ?Financial Resource Strain: Not on file  ?Food Insecurity: Not on file  ?Transportation Needs: Not on file  ?Physical Activity: Not on file  ?Stress: Not on file  ?Social Connections: Not on file  ?Intimate Partner Violence: Not on file  ?  ?Family History  ?Problem Relation Age of Onset  ? Hypertension Mother   ?  ? ? ?Review of systems complete and found to be negative unless listed above  ? ? ?PHYSICAL EXAM ?General: Pleasant middle-aged black male, well nourished, in no acute distress. Significant dyskinesias of his trunk, head, and bilateral upper extremities. Sitting in hospital bed.  ?HEENT:  Normocephalic and atraumatic. ?Neck:  No JVD.  ?Lungs: Normal respiratory effort on room air. Clear bilaterally to auscultation. No wheezes, crackles, rhonchi.  ?Heart: HRRR . Normal S1 and S2 without gallops or murmurs. Radial & DP pulses 2+ bilaterally. ?Abdomen: Non-distended appearing.  ?Msk: Normal strength and tone for age. ?Extremities: Warm and well perfused. No clubbing, cyanosis.  No peripheral edema.  ?Neuro: Alert and oriented X 3. ?Psych:  Answers questions appropriately.  ? ?Labs: ?  ?Lab Results  ?Component Value Date  ? WBC 5.3 09/24/2021  ? HGB 12.9 (L) 09/24/2021  ? HCT 38.6 (L) 09/24/2021  ? MCV 93.7 09/24/2021  ? PLT 278 09/24/2021  ?  ?Recent Labs  ?Lab 09/24/21 ?[id number]  ?NA 138  ?K 3.3*  ?CL 105  ?CO2 26  ?BUN 13  ?CREATININE 0.70  ?CALCIUM 8.6*  ?GLUCOSE 125*  ? ? ?Lab Results  ?Component Value Date  ? CKTOTAL 214 05/25/2021  ? ?  ?Lab Results  ?Component Value Date  ?  CHOL 142 05/26/2021  ? ?Lab Results  ?Component Value Date  ? HDL 39 (L) 05/26/2021  ? ?Lab Results  ?Component Value Date  ? LDLCALC 85 05/26/2021  ? ?Lab Results  ?Component Value Date  ? TRIG 90 05/26/2021  ? ?Lab Results  ?Component Value Date  ? CHOLHDL 3.6 05/26/2021  ? ?No results found for: LDLDIRECT  ?  ?Radiology: CT Head Wo Contrast ? ?Result Date: 09/23/2021 ?CLINICAL DATA:  Head trauma, intracranial venous injury suspected EXAM: CT HEAD WITHOUT CONTRAST TECHNIQUE: Contiguous axial images were obtained from the base of the skull through the vertex without intravenous contrast. RADIATION DOSE REDUCTION: This  exam was performed according to the departmental dose-optimization program which includes automated exposure control, adjustment of the mA and/or kV according to patient size and/or use of iterative reconstruction technique. COMPARISON:  None. FINDINGS: Mildly motion limited study.  Within this limitation: Brain: No evidence of acute infarction, hemorrhage, hydrocephalus, extra-axial collection or mass lesion/mass effect. Partially empty sella. Vascular: No hyperdense vessel identified. Calcific intracranial atherosclerosis. Skull: No acute fracture. Sinuses/Orbits: Clear visualized sinuses. No acute orbital findings. Other: Trace right mastoid effusion. IMPRESSION: Mildly motion limited study without evidence of acute intracranial abnormality. Electronically Signed   By: Feliberto Harts M.D.   On: 09/23/2021 14:45  ? ?CT HEAD WO CONTRAST ( ) ? ?Result Date: 09/15/2021 ?CLINICAL DATA:  Mental status change. EXAM: CT HEAD WITHOUT CONTRAST TECHNIQUE: Contiguous axial images were obtained from the base of the skull through the vertex without intravenous contrast. RADIATION DOSE REDUCTION: This exam was performed according to the departmental dose-optimization program which includes automated exposure control, adjustment of the mA and/or kV according to patient size and/or use of iterative reconstruction  technique. COMPARISON:  None. FINDINGS: Image degraded by head motion. Repeat imaging with some improvement. Brain: No acute intracranial hemorrhage. No focal mass lesion. No CT evidence of acute infarction

## 2021-09-25 NOTE — Evaluation (Signed)
Physical Therapy Evaluation ?Patient Details ?Name: Jeremiah Taylor. ?MRN: [id number] ?DOB: 1961-06-11 ?Today's Date: 09/25/2021 ? ?History of Present Illness ? Pt admitted for complaints of syncopal episodes. Pt with history of Parkinson's disease, asthma, COPD, and HTN.  ?Clinical Impression ? Pt is a pleasant 61 year old male who was admitted for syncopal event. Pt performs bed mobility with independence, transfers with mod I, and ambulation with cga and RW. Pt demonstrates deficits with strength/balance/mobility. Does demonstrate dyskinesia with mobility and is falls risk. Recommend using RW for all mobility, pt agreeable. Would benefit from skilled PT to address above deficits and promote optimal return to PLOF. Continue to recommend OP PT for follow up progression. ?   ? ?Recommendations for follow up therapy are one component of a multi-disciplinary discharge planning process, led by the attending physician.  Recommendations may be updated based on patient status, additional functional criteria and insurance authorization. ? ?Follow Up Recommendations Outpatient PT ? ?  ?Assistance Recommended at Discharge Intermittent Supervision/Assistance  ?Patient can return home with the following ? A little help with walking and/or transfers;A little help with bathing/dressing/bathroom;Help with stairs or ramp for entrance;Assist for transportation ? ?  ?Equipment Recommendations Rolling walker (2 wheels)  ?Recommendations for Other Services ?    ?  ?Functional Status Assessment Patient has had a recent decline in their functional status and demonstrates the ability to make significant improvements in function in a reasonable and predictable amount of time.  ? ?  ?Precautions / Restrictions Precautions ?Precautions: Fall ?Restrictions ?Weight Bearing Restrictions: No  ? ?  ? ?Mobility ? Bed Mobility ?Overal bed mobility: Independent ?  ?  ?  ?  ?  ?  ?General bed mobility comments: safe technique with ease of mobility ?   ? ?Transfers ?Overall transfer level: Modified independent ?Equipment used: Rolling walker (2 wheels) ?  ?  ?  ?  ?  ?  ?  ?General transfer comment: safe technique with upright posture. Increased dyskinesia with active movement ?  ? ?Ambulation/Gait ?Ambulation/Gait assistance: Min guard ?Gait Distance (Feet): 80 Feet ?Assistive device: Rolling walker (2 wheels) ?Gait Pattern/deviations: Step-through pattern ?  ?  ?  ?General Gait Details: ambulated in hallway, initially with steady gait, however with fatigue had balance impairement, needing to hold onto RW for stability. Fatigues with increased ambulatio, requesting to return back to room. increased dyskinesia with mobility. ? ?Stairs ?  ?  ?  ?  ?  ? ?Wheelchair Mobility ?  ? ?Modified Rankin (Stroke Patients Only) ?  ? ?  ? ?Balance Overall balance assessment: History of Falls, Needs assistance ?Sitting-balance support: Feet supported, No upper extremity supported ?Sitting balance-Leahy Scale: Normal ?  ?  ?Standing balance support: Bilateral upper extremity supported ?Standing balance-Leahy Scale: Fair ?  ?  ?  ?  ?  ?  ?  ?  ?  ?  ?  ?  ?   ? ? ? ?Pertinent Vitals/Pain Pain Assessment ?Pain Assessment: No/denies pain  ? ? ?Home Living   ?  ?  ?  ?  ?  ?  ?  ?  ?  ?Additional Comments: lives in hotel right now, in between perm housing  ?  ?Prior Function Prior Level of Function : Independent/Modified Independent;Driving;History of Falls (last six months) ?  ?  ?  ?  ?  ?  ?Mobility Comments: reports he was indep prior, however reports recent falls due to syncope, never had LOC ?  ?  ? ? ?  Hand Dominance  ?   ? ?  ?Extremity/Trunk Assessment  ? Upper Extremity Assessment ?Upper Extremity Assessment: Overall WFL for tasks assessed ?  ? ?Lower Extremity Assessment ?Lower Extremity Assessment: Generalized weakness (B LE grossly 4/5) ?  ? ?   ?Communication  ? Communication: No difficulties  ?Cognition Arousal/Alertness: Awake/alert ?Behavior During Therapy: [facility] for  tasks assessed/performed ?Overall Cognitive Status: Within Functional Limits for tasks assessed ?  ?  ?  ?  ?  ?  ?  ?  ?  ?  ?  ?  ?  ?  ?  ?  ?  ?  ?  ? ?  ?General Comments   ? ?  ?Exercises    ? ?Assessment/Plan  ?  ?PT Assessment Patient needs continued PT services  ?PT Problem List Decreased strength;Decreased activity tolerance;Decreased balance;Decreased mobility ? ?   ?  ?PT Treatment Interventions Gait training;DME instruction;Therapeutic exercise;Balance training   ? ?PT Goals (Current goals can be found in the Care Plan section)  ?Acute Rehab PT Goals ?Patient Stated Goal: to go home ?PT Goal Formulation: With patient ?Time For Goal Achievement: 10/09/21 ?Potential to Achieve Goals: Good ? ?  ?Frequency Min 2X/week ?  ? ? ?Co-evaluation   ?  ?  ?  ?  ? ? ?  ?AM-PAC PT "6 Clicks" Mobility  ?Outcome Measure Help needed turning from your back to your side while in a flat bed without using bedrails?: None ?Help needed moving from lying on your back to sitting on the side of a flat bed without using bedrails?: None ?Help needed moving to and from a bed to a chair (including a wheelchair)?: A Little ?Help needed standing up from a chair using your arms (e.g., wheelchair or bedside chair)?: A Little ?Help needed to walk in hospital room?: A Little ?Help needed climbing 3-5 steps with a railing? : A Lot ?6 Click Score: 19 ? ?  ?End of Session   ?Activity Tolerance: Patient tolerated treatment well ?Patient left: in bed ?Nurse Communication: Mobility status ?PT Visit Diagnosis: Unsteadiness on feet (R26.81);Muscle weakness (generalized) (M62.81);History of falling (Z91.81);Ataxic gait (R26.0) ?  ? ?Time: [id number] ?PT Time Calculation (min) (ACUTE ONLY): 8 min ? ? ?Charges:   PT Evaluation ?$PT Eval Low Complexity: 1 Low ?  ?  ?   ? ? ?Jeremiah Taylor, PT, DPT, GCS ?[phone] ? ? ?Jeremiah Taylor ?09/25/2021, 2:32 PM ? ?

## 2021-09-25 NOTE — Progress Notes (Signed)
[facility]  - [place] [facility] ? ? ?PATIENT NAME: Jeremiah Taylor   ? ?MR#:  [id number] ? ?DATE OF BIRTH:  26-May-1961 ? ?SUBJECTIVE:  ?patient seen earlier started having more dyskinesia with in one hour of taking Sinemet. He ambulated in the hallways with physical therapy using walker. ?VITALS:  ?Blood pressure 133/89, pulse 71, temperature 98.1 ?F (36.7 ?C), resp. rate 18, height 5\' 11"  (1.803 m), weight 77.7 kg, SpO2 100 %. ? ?PHYSICAL EXAMINATION:  ? ?GENERAL:  61 y.o.-year-old patient lying in the bed with no acute distress.  ?LUNGS: Normal breath sounds bilaterally, no wheezing, rales, rhonchi.  ?CARDIOVASCULAR: S1, S2 normal. No murmurs, rubs, or gallops.  ?ABDOMEN: Soft, nontender, nondistended. Bowel sounds present.  ?EXTREMITIES: No  edema b/l.    ?NEUROLOGIC: nonfocal  patient is alert and awake. Dyskinesias in UE ?SKIN: No obvious rash, lesion, or ulcer.  ? ?LABORATORY PANEL:  ?CBC ?Recent Labs  ?Lab 09/24/21 ?11/24/21  ?WBC 5.3  ?HGB 12.9*  ?HCT 38.6*  ?PLT 278  ? ? ? ?Chemistries  ?Recent Labs  ?Lab 09/24/21 ?11/24/21  ?NA 138  ?K 3.3*  ?CL 105  ?CO2 26  ?GLUCOSE 125*  ?BUN 13  ?CREATININE 0.70  ?CALCIUM 8.6*  ? ? ?Cardiac Enzymes ?No results for input(s): TROPONINI in the last 168 hours. ?RADIOLOGY:  ?ECHOCARDIOGRAM COMPLETE ? ?Result Date: 09/24/2021 ?   ECHOCARDIOGRAM REPORT   Patient Name:   Jeremiah Taylor. Date of Exam: 09/24/2021 Medical Rec #:  11/24/2021         Height:       71.0 in Accession #:    [id number]        Weight:       171.3 lb Date of Birth:  Jul 25, 1960        BSA:          1.974 m? Patient Age:    60 years          BP:           135/82 mmHg Patient Gender: M                 HR:           68 bpm. Exam Location:  [facility] Procedure: 2D Echo, Color Doppler and Cardiac Doppler Indications:     R55 Syncope  History:         Patient has no prior history of Echocardiogram examinations.                  Parkinson's disease; Risk Factors:Hypertension.  Sonographer:     07-24-1988  Referring Phys:  Jeremiah Taylor Jeremiah Taylor Diagnosing Phys: Jeremiah Taylor  Sonographer Comments: Suboptimal apical window. TDS due to inability of pt to remain still due to Parkinson's. IMPRESSIONS  1. Left ventricular ejection fraction, by estimation, is 60 to 65%. The left ventricle has normal function. The left ventricle has no regional wall motion abnormalities. Left ventricular diastolic function could not be evaluated.  2. Right ventricular systolic function is normal. The right ventricular size is normal.  3. The mitral valve is normal in structure. No evidence of mitral valve regurgitation. No evidence of mitral stenosis.  4. The aortic valve is normal in structure. Aortic valve regurgitation is not visualized. No aortic stenosis is present.  5. The inferior vena cava is normal in size with greater than 50% respiratory variability, suggesting right atrial pressure of 3 mmHg. FINDINGS  Left Ventricle: Left ventricular ejection fraction,  by estimation, is 60 to 65%. The left ventricle has normal function. The left ventricle has no regional wall motion abnormalities. The left ventricular internal cavity size was normal in size. There is  no left ventricular hypertrophy. Left ventricular diastolic function could not be evaluated. Right Ventricle: The right ventricular size is normal. No increase in right ventricular wall thickness. Right ventricular systolic function is normal. Left Atrium: Left atrial size was normal in size. Right Atrium: Right atrial size was normal in size. Pericardium: There is no evidence of pericardial effusion. Mitral Valve: The mitral valve is normal in structure. No evidence of mitral valve regurgitation. No evidence of mitral valve stenosis. MV peak gradient, 1.3 mmHg. The mean mitral valve gradient is 1.0 mmHg. Tricuspid Valve: The tricuspid valve is normal in structure. Tricuspid valve regurgitation is not demonstrated. No evidence of tricuspid stenosis. Aortic Valve: The aortic valve  is normal in structure. Aortic valve regurgitation is not visualized. No aortic stenosis is present. Aortic valve mean gradient measures 3.0 mmHg. Aortic valve peak gradient measures 5.4 mmHg. Aortic valve area, by VTI measures 4.48 cm?. Pulmonic Valve: The pulmonic valve was not well visualized. Aorta: The aortic root is normal in size and structure. Venous: The inferior vena cava is normal in size with greater than 50% respiratory variability, suggesting right atrial pressure of 3 mmHg. IAS/Shunts: No atrial level shunt detected by color flow Doppler.  LEFT VENTRICLE PLAX 2D LVIDd:         4.52 cm   Diastology LVIDs:         2.63 cm   LV e' medial:   7.72 cm/s LV PW:         1.05 cm   LV E/e' medial: 6.0 LV IVS:        1.05 cm LVOT diam:     2.40 cm LV SV:         94 LV SV Index:   48 LVOT Area:     4.52 cm?  LEFT ATRIUM         Index LA diam:    3.90 cm 1.98 cm/m?  AORTIC VALVE AV Area (Vmax):    3.98 cm? AV Area (Vmean):   4.08 cm? AV Area (VTI):     4.48 cm? AV Vmax:           116.00 cm/s AV Vmean:          77.300 cm/s AV VTI:            0.210 m AV Peak Grad:      5.4 mmHg AV Mean Grad:      3.0 mmHg LVOT Vmax:         102.00 cm/s LVOT Vmean:        69.700 cm/s LVOT VTI:          0.208 m LVOT/AV VTI ratio: 0.99  AORTA Ao Root diam: 3.30 cm MITRAL VALVE MV Area (PHT): 5.02 cm?    SHUNTS MV Area VTI:   7.58 cm?    Systemic VTI:  0.21 m MV Peak grad:  1.3 mmHg    Systemic Diam: 2.40 cm MV Mean grad:  1.0 mmHg MV Vmax:       0.56 m/s MV Vmean:      36.9 cm/s MV Decel Time: 151 msec MV E velocity: 46.70 cm/s MV A velocity: 68.60 cm/s MV E/A ratio:  0.68 Jeremiah Taylor Electronically signed by Jeremiah Taylor Signature Date/Time: 09/24/2021/10:59:25 AM    Final    ? ?  Assessment and Plan ? ?Jeremiah SciaraJohnnie Marinaccio Jr. is a 61 y.o. male with medical history significant for Parkinson's disease, hypertension, anxiety disorder who presents to the emergency room for evaluation " after he fell out twice". ?Patient states that he has been  feeling dizzy and lightheaded for the last 24 hours. ? ?Syncope and collapse ?--Most likely secondary to vasovagal/autonomic dysfunction from known Parkinson's disease ?--remains in NSR ?--Consult cardiology--no recs other than d/c coreg ?-- patient will need Holter monitor at discharge per cardiology ?  ?Parkinson's disease (HCC) ?--Patient noted to have advanced Parkinson's disease with severe dyskinesia ?--Chart review shows that patient has worsening dyskinesia ?Seen by his neurologist Dr Sherryll BurgerShah  in February, 2023 who recommended that patient start Comtan 200 mg 4 x daily, taken with his Sinemet  ?He also recommends that patient discontinue amantadine ?--- neurology consult with Dr Selina CooleyStack ?-- continue current dose of Sinemet, amantadine,entacapone ? ?MDD (major depressive disorder) ?--Recently hospitalized for management of bipolar disorder/major depressive type ?--Continue Lexapro and Klonopin ?--seen by Psychiatry ?  ?HTN (hypertension) ?Blood pressure is stable ?  ?Bipolar 2 disorder, major depressive episode (HCC) ?Treatment as outlined in 3 ?  ?Anxiety ?Continue Klonopin ? ?patient ambulated with physical therapy recommends outpatient PT ? ?Procedures: ?Family communication :none ?Consults :Neurology, Psychiatry ?CODE STATUS: Full ?DVT Prophylaxis : ?Level of care: Med-Surg ?Status is: Observation ?The patient remains OBS appropriate and will d/c before 2 midnights. ?  ?TOC for discharge planning. ? ?TOTAL TIME TAKING CARE OF THIS PATIENT: 25 minutes.  ?>50% time spent on counselling and coordination of care ? ?Note: This dictation was prepared with Dragon dictation along with smaller phrase technology. Any transcriptional errors that result from this process are unintentional. ? ?Enedina FinnerSona Sham Alviar M.D  ? ? ?[facility]  ? ?CC: ?Primary care physician; Enid BaasKalisetti, Radhika, MD  ?

## 2021-09-25 NOTE — Consult Note (Addendum)
NEUROLOGY CONSULTATION NOTE  ? ?Date of service: September 25, 2021 ?Patient Name: Jeremiah Taylor. ?MRN:  [id number] ?DOB:  25-Jan-1961 ?Reason for consult: dyskinesias in setting of PD ?Requesting physician: Dr. Enedina Finner ?_ _ _   _ __   _ __ _ _  __ __   _ __   __ _ ? ?History of Present Illness  ? ?This is a 61 year old gentleman with a history of hypertension and Parkinson's disease with significant dyskinesias followed by Dr. Sherryll Burger as an outpatient.  He was diagnosed with Parkinson's in 2015 and symptoms include worsening tremors worse on the right side, rigidity, anosmia, constipation, dream enactment, past but not current history of visual hallucinations, bradykinesia, orthostatic hypotension.  He was previously evaluated at [facility] for DBS but tells me he elected not to do it because he was scared of the procedure.  Patient has had significant dyskinesias for years.  He reports that these have increased significantly since he was last seen by Dr. Sherryll Burger on February 3 of this year. ? ?Patient was admitted 2/2 syncope as well as worsening dyskinesias. Cardiology saw him and stopped his beta blocker. ? ?Pharmacist has very kindly verified his recent medication changes: ? ?Last Neurology appt w/Dr. Sherryll Burger 07/22/21  ?" Amantadine reduce to 50mg  daily for 1 week then stop "  ?Carbidopa/Levodopa IR 25/100mg  3 tablets qid  ?" Carbidopa/Levodopa CR (Rytary) 61.25/245 1 qid " ? Entacapone 200mg  1 qid " ? Clonazepam 0.5mg  ?-1 tablet at bedtime (for anxiety) ? ?Discharge [facility] from [facility] on 09/18/21  ?" Amantadine 100mg  daily " ? Carbidopa/Levodopa IR 25/100mg  3 qid " ? No Rytarty listed  ?" Entacapone 200mg  1 qid " ? Clonazepam 0.5mg  bid prn and at bedtime ? ?Outpatient Refill Information  ?" Amantadine 100mg  daily - refill 4/7 (70 day supply) "  ?Carbidopa/Levodopa IR 25/100mg  3 qid - refill 2/19 (90 day supply)  ?" Carbidopa/Levodopa CR (Rytary) 61.25/245 1 qid - filled 2/6 (30 day), note from Dr. office says insurance  does not cover "  ?Entacapone 200mg  1 qid - refilled 3/31 (90 day supply)  ?" Clonazepam 0.5mg  ?-1 tablet at bedtime - refilled 3/16 (30 day supply)  ? ?What patient says he takes  ?" Amantadine 100mg  bid "  ?Carbidopa/levodopa IR 25/100mg  3 qid "  ?No Rytary - says too strong  ?" Entacapone 200mg  bid " ? Clonazepam 0.5mg  bid  ? ?Patient is a very good historian and knows exactly what medication changes he has made recently. He does frequently adjust his own medications without Dr. 3/19 oversight. ? ?He is currently on carbidopa/levodopa 25/100mg  3 tabs qid. He states that this medication wears off too quickly, between 2-3 hours after taking it. He was prescribed Rytary (extended release) and although there is a note in the chart that his insurance did not cover it he states that he was able to fill it and has "lots of it" at home but that he feels like it is too much medication.  When specifically asked he does not state that he has side effects on it but generally felt like he was taking too much medication.  Due to the fact that his carbidopa levodopa was wearing off too soon before that the next dose was due Dr. 10-06-1974 did start him on entacapone 200 mg 4 times daily to be taken with every dose of carbidopa levodopa.  Patient again felt that this was too much medication although he did not experience side effects when he  took it, and has just been taking it twice daily in the morning and at night.  He says that when he takes it the effects of the carbidopa/levodopa do last longer, he has less of a wearing off effect, and he does not experience more side effects with the doses of carbidopa levodopa that he also takes the entacapone. ? ?Also patient states that he was actually taking amantadine 100mg  qid not bid as he told Dr. Sherryll BurgerShah, and instead of tapering to off over a few weeks as instructed by Dr. Sherryll BurgerShah he tapered down to 100mg  bid. ? ?His dyskinesias worsened when he started the entacapone and reduced his  amantadine dose. ? ? ?Also of note patient had syncopal episode while gambling but he states this was a "one time thing" and he does not have issues with compulsive gambling or other risk taking. ? ?Head CT in ED personally reviewed - [facility] ?  ?ROS  ? ?Per HPI: all other systems reviewed and are negative ? ?Past History  ? ?I have reviewed the following: ? ?Past Medical History:  ?Diagnosis Date  ? Anxiety   ? Asthma   ? Hypertension   ? Parkinson disease (HCC)   ? ?Past Surgical History:  ?Procedure Laterality Date  ? CARPAL TUNNEL RELEASE Right   ? x3  ? CHOLECYSTECTOMY    ? OLECRANON BURSECTOMY Right 04/29/2021  ? Procedure: Right olecranon bursa excision;  Surgeon: Kennedy BuckerMenz, Michael, MD;  Location: [facility] ORS;  Service: Orthopedics;  Laterality: Right;  ? REPLACEMENT TOTAL KNEE Right   ? ?Family History  ?Problem Relation Age of Onset  ? Hypertension Mother   ? ?Social History  ? ?Socioeconomic History  ? Marital status: Married  ?  Spouse name: Not on file  ? Number of children: Not on file  ? Years of education: Not on file  ? Highest education level: Not on file  ?Occupational History  ? Not on file  ?Tobacco Use  ? Smoking status: Former  ?  Types: Cigarettes  ?  Quit date: 07/19/2021  ?  Years since quitting: 0.1  ? Smokeless tobacco: Former  ?Vaping Use  ? Vaping Use: Never used  ?Substance and Sexual Activity  ? Alcohol use: Not Currently  ? Drug use: Not Currently  ? Sexual activity: Not on file  ?Other Topics Concern  ? Not on file  ?Social History Narrative  ? Not on file  ? ?Social Determinants of Health  ? ?Financial Resource Strain: Not on file  ?Food Insecurity: Not on file  ?Transportation Needs: Not on file  ?Physical Activity: Not on file  ?Stress: Not on file  ?Social Connections: Not on file  ? ?No Known Allergies ? ?Medications  ? ?Medications Prior to Admission  ?Medication Sig Dispense Refill Last Dose  ? albuterol (VENTOLIN HFA) 108 (90 Base) MCG/ACT inhaler Inhale 1-2 puffs into the lungs every 6  (six) hours as needed for wheezing or shortness of breath.   Past Month  ? amLODipine-benazepril (LOTREL) 10-40 MG capsule Take 1 capsule by mouth daily.   09/23/2021  ? aspirin EC 81 MG EC tablet Take 1 tablet (81 mg total) by mouth daily. Swallow whole. 30 tablet 11 09/22/2021  ? atorvastatin (LIPITOR) 80 MG tablet Take 40 mg by mouth at bedtime.   09/22/2021  ? budesonide (PULMICORT) 0.5 MG/2ML nebulizer solution Take 0.5 mg by nebulization in the morning and at bedtime.   09/22/2021  ? carbidopa-levodopa (SINEMET IR) 25-100 MG tablet Take 3 tablets  by mouth 4 (four) times daily.   09/23/2021  ? carvedilol (COREG) 12.5 MG tablet Take 12.5 mg by mouth 2 (two) times daily.   09/23/2021 at 0900  ? cetirizine (ZYRTEC) 10 MG tablet Take 10 mg by mouth daily.   09/23/2021  ? cholecalciferol (VITAMIN D3) 25 MCG (1000 UNIT) tablet Take 1,000 Units by mouth daily.   09/23/2021  ? clonazePAM (KLONOPIN) 0.5 MG tablet Take 0.5 mg by mouth See admin instructions. Take 0.5mg  twice daily as needed for tremors and 0.5mg  at bedtime for sleep.   09/22/2021  ? entacapone (COMTAN) 200 MG tablet Take 200 mg by mouth 4 (four) times daily.   09/23/2021  ? escitalopram (LEXAPRO) 10 MG tablet Take 10 mg by mouth daily.   09/23/2021  ? fluticasone (FLONASE) 50 MCG/ACT nasal spray Place 1 spray into the nose 2 (two) times daily as needed for allergies.   09/23/2021  ? gabapentin (NEURONTIN) 100 MG capsule Take 1 capsule (100 mg total) by mouth 3 (three) times daily. (Patient taking differently: Take 100 mg by mouth 2 (two) times daily.) 90 capsule 0 09/23/2021  ? SYMBICORT 160-4.5 MCG/ACT inhaler Inhale 2 puffs into the lungs See admin instructions. Twice daily every other day   09/23/2021  ? TRELEGY ELLIPTA 200-62.5-25 MCG/ACT AEPB Inhale 1 puff into the lungs every other day.   09/22/2021  ? Amantadine HCl 100 MG tablet Take 1 tablet (100 mg total) by mouth daily. (Patient taking differently: Take 100 mg by mouth 2 (two) times daily.) 30 tablet 0   ?  Carbidopa-Levodopa ER 61.25-245 MG CPCR Take 1 capsule by mouth 4 (four) times daily. (Patient not taking: Reported on 09/24/2021)   Not Taking  ?  ? ? ?Current Facility-Administered Medications:  ?  acetami

## 2021-09-25 NOTE — Progress Notes (Signed)
S: Patient tolerated qid entacapone. He says his dyskinesias are not worse after the times he takes entacapone with his carbidopa-levodopa vs the times he takes carbidopa-levodopa without. He kept a good journal which revealed dyskinesias worst approx 60 min after medication admin (peak dose dyskinesias). His amantadine was actually held on admission so it was restarted today at 100mg  bid. No new neurologic complaints today. ? ?O: ? ?Vitals:  ? 09/25/21 1229 09/25/21 1602  ?BP: 133/89 136/84  ?Pulse: 71 65  ?Resp: 18 18  ?Temp: 98.1 ?F (36.7 ?C) 98 ?F (36.7 ?C)  ?SpO2: 100% 100%  ? ?Physical Exam ?Gen: A&O x4, NAD ?HEENT: Atraumatic, normocephalic;mucous membranes moist; oropharynx clear, tongue without atrophy or fasciculations. ?Neck: Supple, trachea midline. ?Resp: CTAB, no w/r/r ?CV: RRR, no m/g/r; nml S1 and S2. 2+ symmetric peripheral pulses. ?Abd: soft/NT/ND; nabs x 4 quad ?Extrem: Nml bulk; no cyanosis, clubbing, or edema. ?  ?Neuro: ?*MS: A&O x4. Follows multi-step commands.  ?*Speech: fluid, nondysarthric, able to name and repeat ?*CN:  ?  I: Deferred ?  II,III: PERRLA, VFF by confrontation, optic discs unable to be visualized 2/2 pupillary constriction ?  III,IV,VI: EOMI w/o nystagmus, no ptosis ?  V: Sensation intact from V1 to V3 to LT ?  VII: Eyelid closure was full.  Smile symmetric. ?  VIII: Hearing intact to voice ?  IX,X: Voice normal, palate elevates symmetrically  ?  XI: SCM/trap 5/5 bilat   ?XII: Tongue protrudes midline, no atrophy or fasciculations  ?  ?*Motor:   Normal bulk.  Mild bilateral cogwheeling rigidity. No tremor. Prominent dyskinesias of neck > BUE. ?  ?  Strength: Dlt Bic Tri WrE WrF FgS Gr HF KnF KnE PlF DoF  ?  Left 5 5 5 5 5 5 5 5 5 5 5 5   ?  Right 5 5 5 5 5 5 5 5 5 5 5 5   ?  ?  ?*Sensory: Intact to light touch, pinprick, temperature vibration throughout. Symmetric. Propioception intact bilat.  No double-simultaneous extinction.  ?*Coordination:  Finger-to-nose, heel-to-shin,  rapid alternating motions were bradykinetic and incoordinated ?*Reflexes:  2+ and symmetric throughout without clonus; toes down-going bilat ?*Gait: deferred ? ?A/P: This is a 61 year old gentleman with a history of hypertension and Parkinson's disease with significant dyskinesias followed by Dr. as an outpatient.  He was diagnosed with Parkinson's in 2015 and symptoms include worsening tremors worse on the right side, rigidity, anosmia, constipation, dream enactment, past but not current history of visual hallucinations, bradykinesia, orthostatic hypotension.  He is admitted for syncope and worsening dyskinesias. ?  ?Syncope likely related to orthostatic hypotension in the setting of PD, less likely 2/2 medication effect from PD meds bc he is on less carbidopa-levodopa than he was on previously 2/2 stopping the ER formulations. Cardiology saw him and stopped his beta blocker, neuro is in agrement with this. ?  ?Patient frequently adjusts his own medications without discussing with Dr. 68. His dyskinesias are chronic but worsened about a month ago in the setting of 2 medication changes that could have caused this: starting entacapone and decreasing amantadine.  His says his levodopa-carbidopa wears off before next dose, so I do think entacapone would benefit him if he can tolerate it but he should take it qid. Yesterday we increased entacapone dosing to qid and he seems to be tolerating it well. Amantadine was inadvertently held on admission, so will restart it today at 100mg  bid. We may need to increase this to 100mg   tid to better control his dyskinesias, but will wait and see how he does on restarted 100mg  bid. ? ?Patient has been keeping sx journal, his dyskinesias are worst at peak-dose carbidopa-levodopa. He may benefit from fractionation if amantadine does not control his sx. ? ?Patient would like to readdress possibility of DBS with Dr. when he sees him in outpatient f/u. ? ?# PD w/ prominent  dyskinesias (peak dose) ?- Continue carbidopa-levodopa 25/100 three tabs qid. Adjusted schedule to reflect home dosing times - 0800/1200/1600/2000 ?- Continue entacapone qid, to be taken with carbidopa-levodopa ?- Restart amantadine 100mg  bid ?- Patient to keep journal while admitted documenting medication times, when carbidopa-levodopa wears off, when dyskinesias are at their worst and if they change with medication changes, and note any other side effects ?  ?# Syncope, likely 2/2 orthostatic hypotension 2/2 PD ?- Agree with discontinuation of beta blocker ?  ?Will continue to follow. ? ?07-05-1976, MD ?[facility] ?[phone] ? ?If 7pm- 7am, please page neurology on call as listed in [facility]. ? ?

## 2021-09-26 ENCOUNTER — Encounter: Payer: Self-pay | Admitting: Internal Medicine

## 2021-09-26 DIAGNOSIS — G20 Parkinson's disease: Secondary | ICD-10-CM | POA: Diagnosis not present

## 2021-09-26 LAB — GLUCOSE, CAPILLARY: Glucose-Capillary: 114 mg/dL — ABNORMAL HIGH (ref 70–99)

## 2021-09-26 MED ORDER — ENTACAPONE 200 MG PO TABS: 200.0000 mg | ORAL_TABLET | Freq: Four times a day (QID) | ORAL | 1 refills | Status: AC

## 2021-09-26 MED ORDER — AMANTADINE HCL 100 MG PO TABS
100.0000 mg | ORAL_TABLET | Freq: Two times a day (BID) | ORAL | 0 refills | Status: AC
Start: 2021-09-26 — End: 2021-10-26

## 2021-09-26 MED ORDER — GABAPENTIN 100 MG PO CAPS: 100.0000 mg | ORAL_CAPSULE | Freq: Two times a day (BID) | ORAL | 1 refills | Status: AC

## 2021-09-26 MED ORDER — CARBIDOPA-LEVODOPA 25-100 MG PO TABS: 3.0000 | ORAL_TABLET | Freq: Four times a day (QID) | ORAL | 1 refills | Status: AC

## 2021-09-26 NOTE — Progress Notes (Signed)
[facility] ? ? ?PATIENT NAME: Jeremiah Taylor   ? ?MR#:  [id number] ? ?DATE OF BIRTH:  14-Jan-1961 ? ?SUBJECTIVE:  ?Dyskinesia much better ?No family at bedside ?Ambulates with walker ?VITALS:  ?Blood pressure (!) 139/91, pulse 74, temperature 98.8 ?F (37.1 ?C), resp. rate 15, height 5\' 11"  (1.803 m), weight 77.7 kg, SpO2 99 %. ? ?PHYSICAL EXAMINATION:  ? ?GENERAL:  61 y.o.-year-old patient lying in the bed with no acute distress.  ?LUNGS: Normal breath sounds bilaterally, no wheezing, rales, rhonchi.  ?CARDIOVASCULAR: S1, S2 normal. No murmurs, rubs, or gallops.  ?ABDOMEN: Soft, nontender, nondistended. Bowel sounds present.  ?EXTREMITIES: No  edema b/l.    ?NEUROLOGIC: nonfocal  patient is alert and awake. Dyskinesias in UE ?SKIN: No obvious rash, lesion, or ulcer.  ? ?LABORATORY PANEL:  ?CBC ?Recent Labs  ?Lab 09/24/21 ?[id number]  ?WBC 5.3  ?HGB 12.9*  ?HCT 38.6*  ?PLT 278  ? ? ? ?Chemistries  ?Recent Labs  ?Lab 09/24/21 ?[id number]  ?NA 138  ?K 3.3*  ?CL 105  ?CO2 26  ?GLUCOSE 125*  ?BUN 13  ?CREATININE 0.70  ?CALCIUM 8.6*  ? ? ?Cardiac Enzymes ?No results for input(s): TROPONINI in the last 168 hours. ?RADIOLOGY:  ?No results found. ? ?Assessment and Plan ? ?Oneal Collaso. is a 61 y.o. male with medical history significant for Parkinson's disease, hypertension, anxiety disorder who presents to the emergency room for evaluation " after he fell out twice". ?Patient states that he has been feeling dizzy and lightheaded for the last 24 hours. ? ?Syncope and collapse ?--Most likely secondary to vasovagal/autonomic dysfunction from known Parkinson's disease ?--remains in NSR ?--Consult cardiology--no recs other than d/c coreg ?-- patient will need Holter monitor at discharge per cardiology ?  ?Parkinson's disease ([place]) ?--Patient noted to have advanced Parkinson's disease with severe dyskinesia ?--Chart review shows that patient has worsening dyskinesia ?Seen by his neurologist Dr Manuella Ghazi   in February, 2023 who recommended that patient start Comtan 200 mg 4 x daily, taken with his Sinemet  ?He also recommends that patient discontinue amantadine ?--- neurology consult with Dr Quinn Axe ?-- continue current dose of Sinemet, amantadine,entacapone, klonopin ? ?MDD (major depressive disorder) ?--Recently hospitalized for management of bipolar disorder/major depressive type ?--Continue Lexapro and Klonopin ?--seen by Psychiatry ?  ?HTN (hypertension) ?Blood pressure is stable ?  ?Bipolar 2 disorder, major depressive episode ([place]) ?Treatment as outlined in 3 ?  ?Anxiety ?Continue Klonopin ? ?patient ambulated with physical therapy recommends outpatient PT (referral done) ? ?Procedures: ?Family communication :none ?Consults :Neurology, Psychiatry ?CODE STATUS: Full ?DVT Prophylaxis : ?Level of care: Med-Surg ?Status is: Observation ?The patient remains OBS appropriate and will d/c before 2 midnights. ?  ?TOC for discharge planning ?D/c planning pending neuro rec if pt is ok to d/c. ? ?TOTAL TIME TAKING CARE OF THIS PATIENT: 25 minutes.  ?>50% time spent on counselling and coordination of care ? ?Note: This dictation was prepared with Dragon dictation along with smaller phrase technology. Any transcriptional errors that result from this process are unintentional. ? ?Fritzi Mandes M.D  ? ? ?[facility]  ? ?CC: ?Primary care physician; Gladstone Lighter, MD  ?

## 2021-09-26 NOTE — TOC Progression Note (Signed)
Transition of Care (TOC) - Progression Note  ? ? ?Patient Details  ?Name: Jeremiah Taylor. ?MRN: [id number] ?Date of Birth: [birth year] ? ?Transition of Care (TOC) CM/SW Contact  ?Marlowe Sax, RN ?Phone Number: ?09/26/2021, 10:08 AM ? ?Clinical Narrative:    ? ?Patient lives in a hotel at the [facility] ?Faxed referral for Outpatient Pt to [facility] outpatient department  ? ?  ? He will need a rolling walker delivered by [employer] to the bedside ? ?Expected Discharge Plan and Services ?  ?  ?  ?  ?  ?                ?  ?  ?  ?  ?  ?  ?  ?  ?  ?  ? ? ?Social Determinants of Health (SDOH) Interventions ?  ? ?Readmission Risk Interventions ?   ? View : No data to display.  ?  ?  ?  ? ? ?

## 2021-09-26 NOTE — Discharge Summary (Signed)
?Physician Discharge Summary ?  ?Patient: Jeremiah SciaraJohnnie Pimenta Jr. MRN: [id number] DOB: 01-27-1961  ?Admit date:     09/23/2021  ?Discharge date: 09/26/21  ?Discharge Physician: Enedina FinnerSona Hilary Milks  ? ?PCP: Enid BaasKalisetti, Radhika, MD  ? ?Recommendations at discharge:  ? ? F/u Dr Beatrix Fettersrgel in 4 weeks. Holter moniter f/u ?F/u Dr Sherryll Burgershah in 1-2 weeks. Pt to make appt ?F/u PCP in 1-2 weeks ? ?Discharge Diagnoses: ?Dyskinesia  ?Parkinson Disease ? ?Hospital Course: ? ?Jeremiah SciaraJohnnie Prentiss Jr. is a 61 y.o. male with medical history significant for Parkinson's disease, hypertension, anxiety disorder who presents to the emergency room for evaluation " after he fell out twice". ?Patient states that he has been feeling dizzy and lightheaded for the last 24 hours. ?  ?Syncope and collapse ?--Most likely secondary to vasovagal/autonomic dysfunction from known Parkinson's disease ?--remains in NSR ?--Consult cardiology--no recs other than d/c coreg ?-- patient will need Holter monitor at discharge per cardiology ?  ?Parkinson's disease ([facility]) ?--Patient noted to have advanced Parkinson's disease with severe dyskinesia ?--Chart review shows that patient has worsening dyskinesia ?Seen by his neurologist Dr Sherryll BurgerShah  in February, 2023 who recommended that patient start Comtan 200 mg 4 x daily, taken with his Sinemet  ?He also recommends that patient discontinue amantadine ?--neurology consult with Dr Selina CooleyStack ?-- continue current dose of Sinemet, amantadine,entacapone, klonopin ?--pt wants to go home--ok from Neuro standpoint with out pt f/u Dr Sherryll BurgerShah  ?  ?MDD (major depressive disorder) ?--Recently hospitalized for management of bipolar disorder/major depressive type ?--Continue Lexapro and Klonopin ?--seen by Psychiatry ?  ?HTN (hypertension) ?Blood pressure is stable ?  ?Bipolar 2 disorder, major depressive episode ([facility]) ?--Treatment as outlined in 3 ?  ?Anxiety ?--Continue Klonopin ?  ?patient ambulated with physical therapy recommends outpatient PT (referral  done) ? ?Family communication :none ?Consults :Neurology, Psychiatry ?CODE STATUS: Full ? ? ?  ? ? ?Consultants: Neurology, Psychiatry ?Disposition: Home ?Diet recommendation:  ?Discharge Diet Orders (From admission, onward)  ? ?  Start     Ordered  ? 09/26/21 0000  Diet - low sodium heart healthy       ? 09/26/21 1458  ? ?  ?  ? ?  ? ?Regular diet ?DISCHARGE MEDICATION: ?Allergies as of 09/26/2021   ?No Known Allergies ?  ? ?  ?Medication List  ?  ? ?STOP taking these medications   ? ?carvedilol 12.5 MG tablet ?Commonly known as: COREG ?  ? ?  ? ?TAKE these medications   ? ?albuterol 108 (90 Base) MCG/ACT inhaler ?Commonly known as: VENTOLIN HFA ?Inhale 1-2 puffs into the lungs every 6 (six) hours as needed for wheezing or shortness of breath. ?  ?Amantadine HCl 100 MG tablet ?Take 1 tablet (100 mg total) by mouth 2 (two) times daily. ?  ?amLODipine-benazepril 10-40 MG capsule ?Commonly known as: LOTREL ?Take 1 capsule by mouth daily. ?  ?aspirin 81 MG EC tablet ?Take 1 tablet (81 mg total) by mouth daily. Swallow whole. ?  ?atorvastatin 80 MG tablet ?Commonly known as: LIPITOR ?Take 40 mg by mouth at bedtime. ?  ?budesonide 0.5 MG/2ML nebulizer solution ?Commonly known as: PULMICORT ?Take 0.5 mg by nebulization in the morning and at bedtime. ?  ?carbidopa-levodopa 25-100 MG tablet ?Commonly known as: SINEMET IR ?Take 3 tablets by mouth 4 (four) times daily. ?What changed: Another medication with the same name was removed. Continue taking this medication, and follow the directions you see here. ?  ?cetirizine 10 MG tablet ?Commonly known as: ZYRTEC ?  Take 10 mg by mouth daily. ?  ?cholecalciferol 25 MCG (1000 UNIT) tablet ?Commonly known as: VITAMIN D3 ?Take 1,000 Units by mouth daily. ?  ?clonazePAM 0.5 MG tablet ?Commonly known as: KLONOPIN ?Take 0.5 mg by mouth See admin instructions. Take 0.5mg  twice daily as needed for tremors and 0.5mg  at bedtime for sleep. ?  ?entacapone 200 MG tablet ?Commonly known as:  COMTAN ?Take 1 tablet (200 mg total) by mouth 4 (four) times daily. ?  ?escitalopram 10 MG tablet ?Commonly known as: LEXAPRO ?Take 10 mg by mouth daily. ?  ?fluticasone 50 MCG/ACT nasal spray ?Commonly known as: FLONASE ?Place 1 spray into the nose 2 (two) times daily as needed for allergies. ?  ?gabapentin 100 MG capsule ?Commonly known as: Neurontin ?Take 1 capsule (100 mg total) by mouth 2 (two) times daily. ?  ?Symbicort 160-4.5 MCG/ACT inhaler ?Generic drug: budesonide-formoterol ?Inhale 2 puffs into the lungs See admin instructions. Twice daily every other day ?  ?Trelegy Ellipta 200-62.5-25 MCG/ACT Aepb ?Generic drug: Fluticasone-Umeclidin-Vilant ?Inhale 1 puff into the lungs every other day. ?  ? ?  ? ?  ?  ? ? ?  ?Durable Medical Equipment  ?(From admission, onward)  ?  ? ? ?  ? ?  Start     Ordered  ? 09/26/21 1010  For home use only DME Walker rolling  Once       ?Question Answer Comment  ?Walker: With 5 Inch Wheels   ?Patient needs a walker to treat with the following condition Difficulty walking   ?  ? 09/26/21 1009  ? ?  ?  ? ?  ? ? Follow-up Information   ? ? Armando Reichert, MD. Go in 4 week(s).   ?Specialty: Cardiology ?Contact information: ?[address] ?[place] [address] ?[phone] ? ? ?  ?  ? ? Enid Baas, MD. Schedule an appointment as soon as possible for a visit in 1 week(s).   ?Specialty: Internal Medicine ?Why: hospital f/u ?Contact information: ?[address] ?[place] [address] ?[phone] ? ? ?  ?  ? ? Lonell Face, MD. Call in 1 week(s).   ?Specialty: Neurology ?Why: pt to call and make appt ?Contact information: ?[address] ?[facility] ?[place] [address] ?[phone] ? ? ?  ?  ? ?  ?  ? ?  ? ?Discharge Exam: ?Filed Weights  ? 09/23/21 1339 09/24/21 0500  ?Weight: 72 kg 77.7 kg  ? ? ? ?Condition at discharge: fair ? ?The results of significant diagnostics from this hospitalization (including imaging,  microbiology, ancillary and laboratory) are listed below for reference.  ? ?Imaging Studies: ?CT Head Wo Contrast ? ?Result Date: 09/23/2021 ?CLINICAL DATA:  Head trauma, intracranial venous injury suspected EXAM: CT HEAD WITHOUT CONTRAST TECHNIQUE: Contiguous axial images were obtained from the base of the skull through the vertex without intravenous contrast. RADIATION DOSE REDUCTION: This exam was performed according to the departmental dose-optimization program which includes automated exposure control, adjustment of the mA and/or kV according to patient size and/or use of iterative reconstruction technique. COMPARISON:  None. FINDINGS: Mildly motion limited study.  Within this limitation: Brain: No evidence of acute infarction, hemorrhage, hydrocephalus, extra-axial collection or mass lesion/mass effect. Partially empty sella. Vascular: No hyperdense vessel identified. Calcific intracranial atherosclerosis. Skull: No acute fracture. Sinuses/Orbits: Clear visualized sinuses. No acute orbital findings. Other: Trace right mastoid effusion. IMPRESSION: Mildly motion limited study without evidence of acute intracranial abnormality. Electronically Signed  By: Feliberto Harts M.D.   On: 09/23/2021 14:45  ? ?CT HEAD WO CONTRAST ( ) ? ?Result Date: 09/15/2021 ?CLINICAL DATA:  Mental status change. EXAM: CT HEAD WITHOUT CONTRAST TECHNIQUE: Contiguous axial images were obtained from the base of the skull through the vertex without intravenous contrast. RADIATION DOSE REDUCTION: This exam was performed according to the departmental dose-optimization program which includes automated exposure control, adjustment of the mA and/or kV according to patient size and/or use of iterative reconstruction technique. COMPARISON:  None. FINDINGS: Image degraded by head motion. Repeat imaging with some improvement. Brain: No acute intracranial hemorrhage. No focal mass lesion. No CT evidence of acute infarction. No midline shift or  mass effect. No hydrocephalus. Basilar cisterns are patent. Vascular: No hyperdense vessel or unexpected calcification. Skull: Normal. Negative for fracture or focal lesion. Sinuses/Orbits: Paranasal sinuses and mastoid air cells a

## 2021-09-26 NOTE — Progress Notes (Signed)
Discharge Note: ?Reviewed discharge instructions with pt. Pt verbalized understanding. IV cath intact when removed, Pt discharged with all personal belongings.  ?

## 2021-10-04 IMAGING — CT CT HEAD W/O CM
4 series · 15 of 47 positions shown, 17 images · non-contrast
Comparison: None

CLINICAL DATA: Blunt head injury, neck injury

EXAM:
CT HEAD WITHOUT CONTRAST
CT CERVICAL SPINE WITHOUT CONTRAST
TECHNIQUE: Multidetector CT imaging of the head and cervical spine was
performed following the standard protocol without intravenous
contrast. Multiplanar CT image reconstructions of the cervical spine
were also generated.

[Series 4: head wo · axial · 0.43mm/px · z∈[-74,+51]mm · 7 of 35 slices shown, 9 images]
[im 5/35  brain]
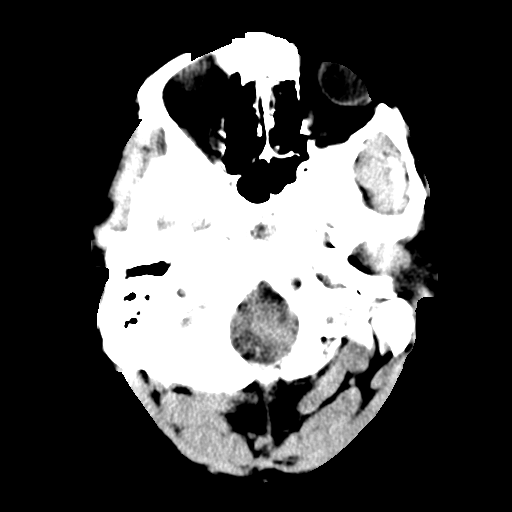
[im 5/35  bone]
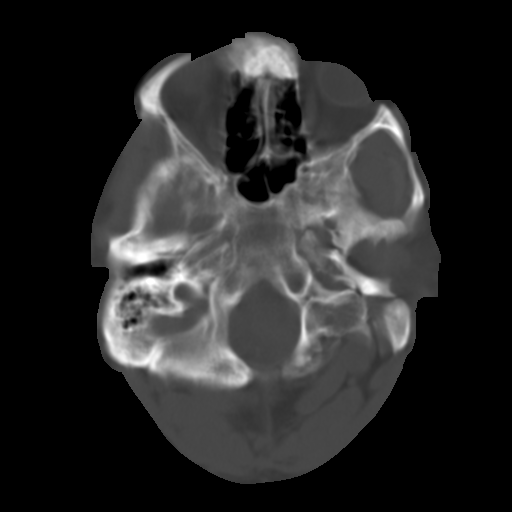
[im 9/35  brain]
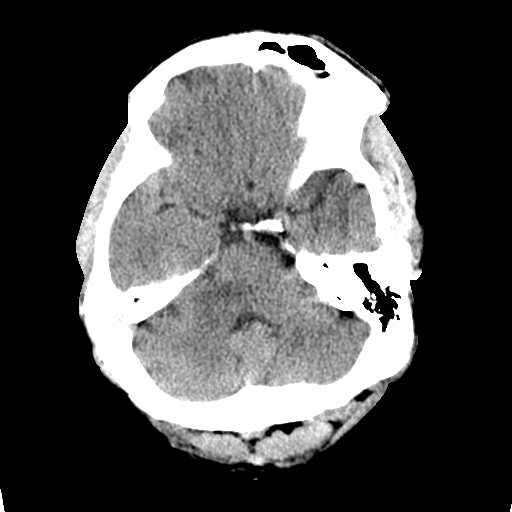
[im 13/35  brain]
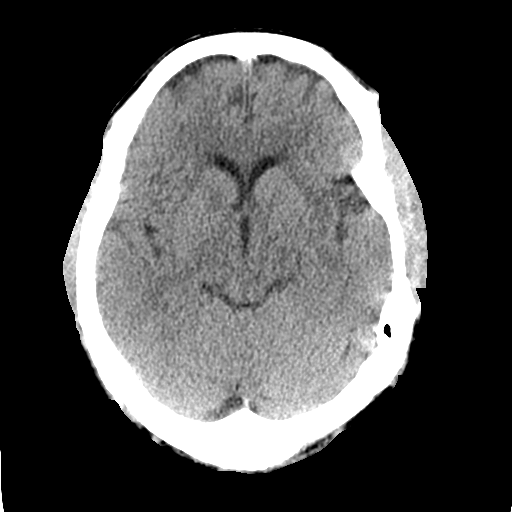
[im 18/35  brain]
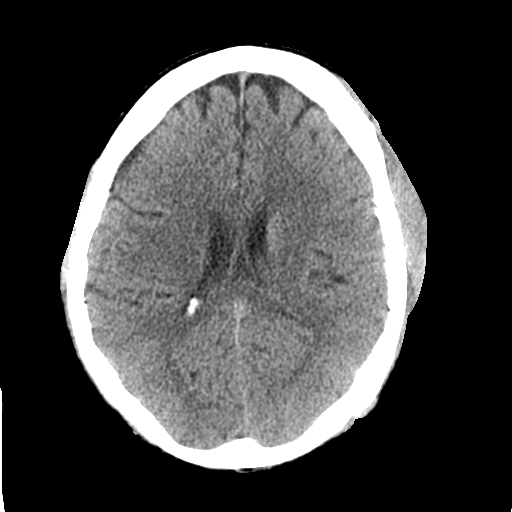
[im 22/35  brain]
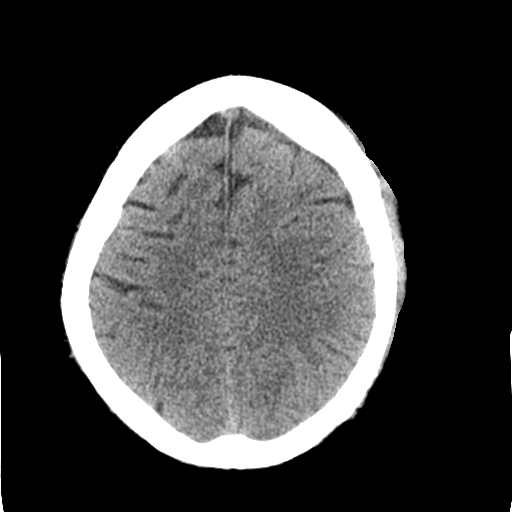
[im 22/35  bone]
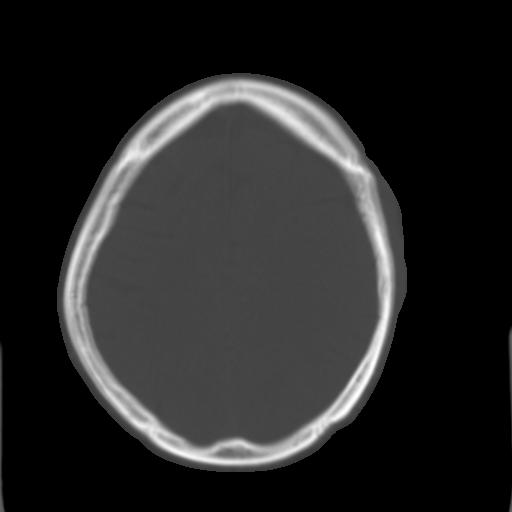
[im 26/35  brain]
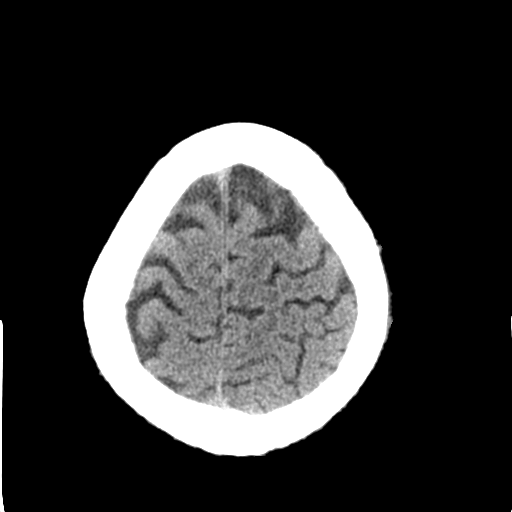
[im 30/35  brain]
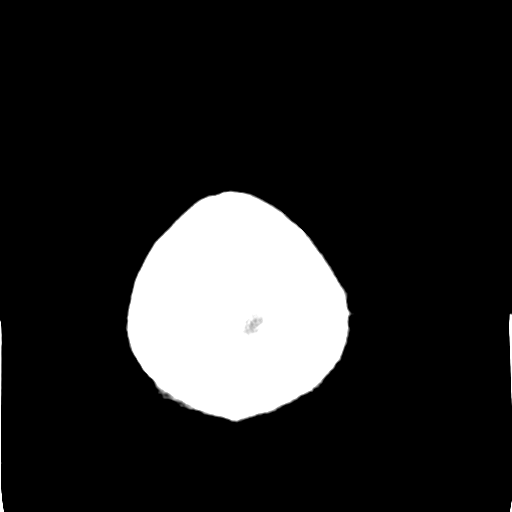

[Series 5: head bone · axial · 0.43mm/px · z∈[-78,-62]mm · 2 of 85 slices shown]
[im 9/85  bone]
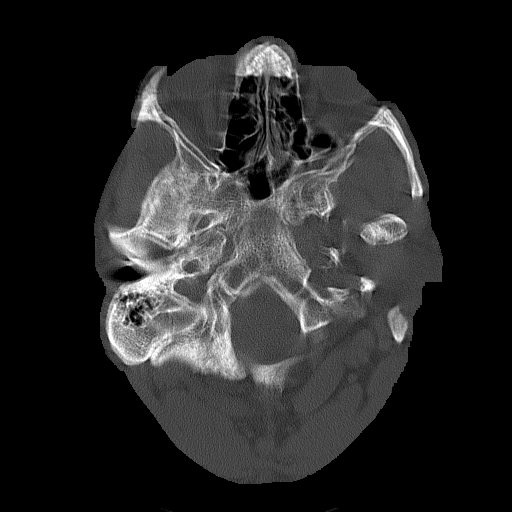
[im 17/85  bone]
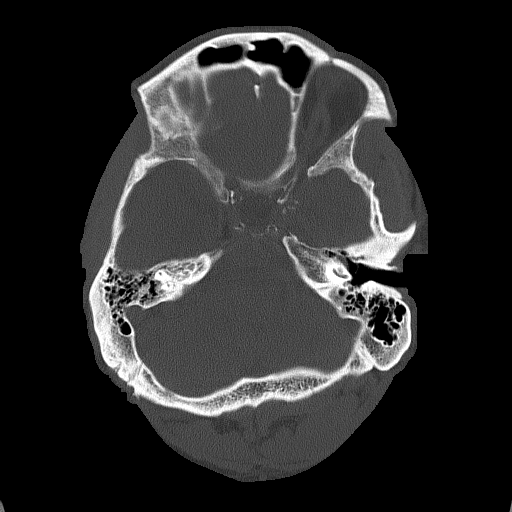

[Series 6: coronal soft tissue · coronal · 0.37mm/px · 3 of 68 slices shown]
[im 23/68  brain]
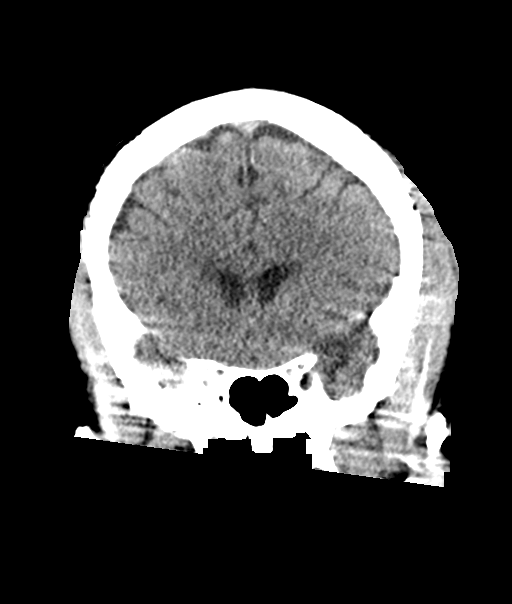
[im 30/68  brain]
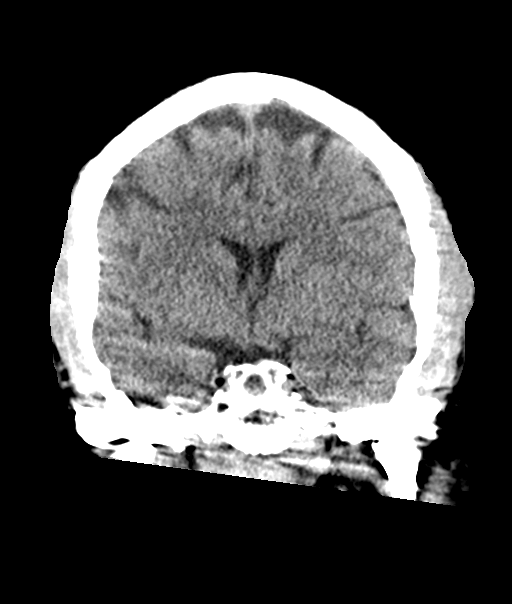
[im 38/68  brain]
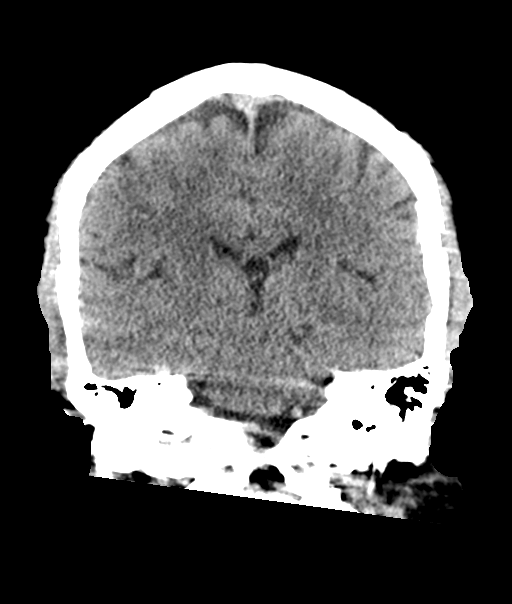

[Series 7: sagittal soft tissue · sagittal · 0.36mm/px · 3 of 63 slices shown]
[im 23/63  brain]
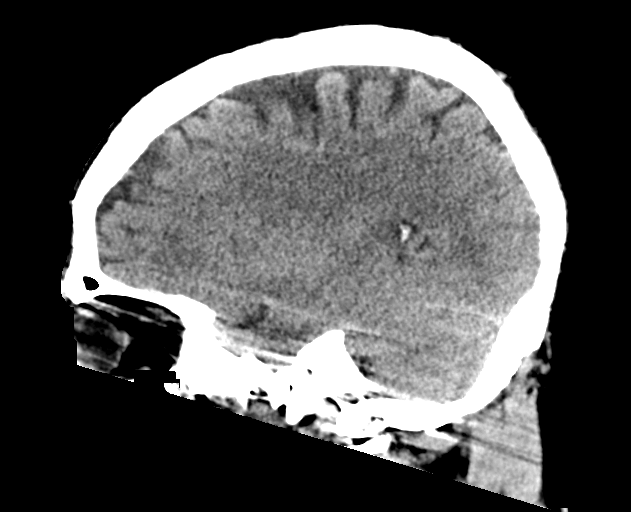
[im 32/63  brain]
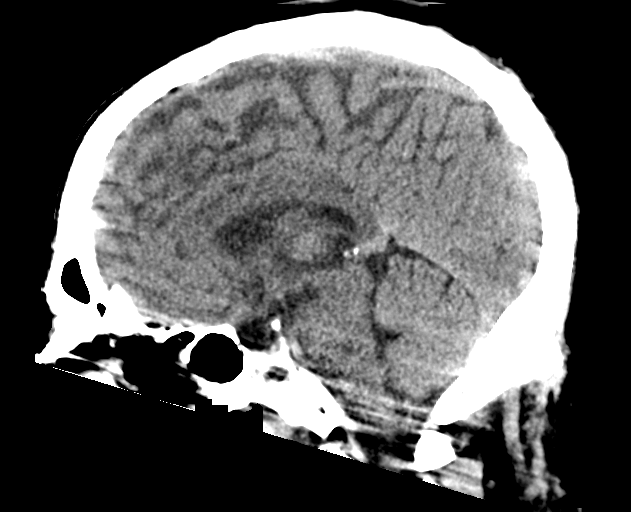
[im 40/63  brain]
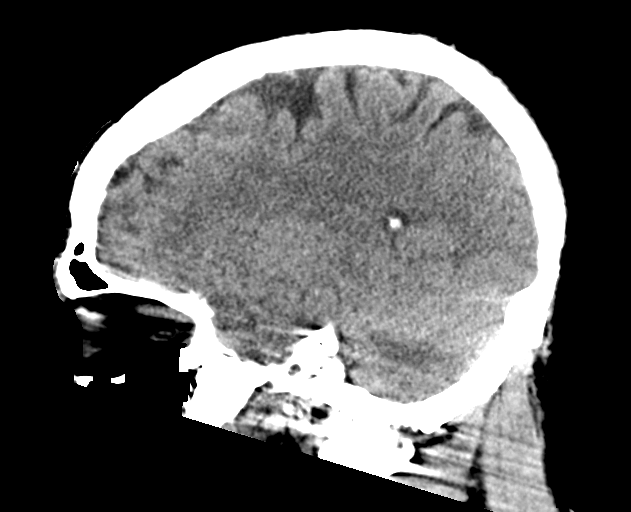

[15 of 47 positions shown; findings below may reference images not displayed]

FINDINGS: CT HEAD FINDINGS

Brain: Imaging is slightly limited by motion artifact. No abnormal
intra or extra-axial mass lesion or fluid collection. No abnormal
mass effect or midline shift. No evidence of acute intracranial
hemorrhage or infarct. Ventricular size is normal. Cerebellum
unremarkable.

Vascular: Unremarkable

Skull: Intact

Sinuses/Orbits: Paranasal sinuses are clear. Orbits are
unremarkable.

Other: Mastoid air cells and middle ear cavities are clear. Small
left frontotemporal scalp hematoma noted.

CT CERVICAL SPINE FINDINGS

Alignment: 2-3 mm retrolisthesis of C6 upon C7. Mild reversal of the
normal cervical lordosis.

Skull base and vertebrae: Imaging is slightly limited by motion
artifact. Craniocervical alignment is normal. Atlantodental interval
is not widened. No acute fracture identified.

Soft tissues and spinal canal: Moderate central canal stenosis
secondary to posterior disc osteophyte complex at C6-7 results in
mild flattening of the thecal sac, not well assessed on this
examination. No canal hematoma. The prevertebral soft tissues are
not thickened. No paraspinal fluid collection.

Disc levels: There is intervertebral disc space narrowing and
endplate remodeling at C5-C7 in keeping with changes of moderate
degenerative disc disease. Milder degenerative changes are noted at
C4-5. Vertebral body heights are preserved. The prevertebral soft
tissues are not thickened on sagittal reformats. Review of the axial
images demonstrates moderate uncovertebral arthrosis at C6-7 and
C5-6 resulting in mild-to-moderate bilateral neuroforaminal
narrowing at C5-6 remaining neural foramina are widely patent.

Upper chest: Unremarkable

Other: None
IMPRESSION: No acute intracranial injury. No calvarial fracture. Small left
frontal scalp hematoma.

No acute fracture or listhesis of the cervical spine.

Degenerative disc and degenerative joint disease at C5-C7 resulting
in moderate central canal stenosis at C6-7 and mild-to-moderate
bilateral neuroforaminal narrowing at C5-6.
